# Patient Record
Sex: Female | Born: 1950 | Race: White | Hispanic: No | Smoking: Current every day smoker
Health system: Southern US, Community
[De-identification: ages and names within clinical notes are randomized; demographics above are authoritative.]

## PROBLEM LIST (undated history)

## (undated) DIAGNOSIS — E785 Hyperlipidemia, unspecified: Secondary | ICD-10-CM

## (undated) DIAGNOSIS — Z1239 Encounter for other screening for malignant neoplasm of breast: Secondary | ICD-10-CM

## (undated) DIAGNOSIS — M545 Low back pain, unspecified: Secondary | ICD-10-CM

## (undated) DIAGNOSIS — N3090 Cystitis, unspecified without hematuria: Secondary | ICD-10-CM

## (undated) DIAGNOSIS — K5792 Diverticulitis of intestine, part unspecified, without perforation or abscess without bleeding: Secondary | ICD-10-CM

## (undated) DIAGNOSIS — N059 Unspecified nephritic syndrome with unspecified morphologic changes: Secondary | ICD-10-CM

## (undated) DIAGNOSIS — Z1211 Encounter for screening for malignant neoplasm of colon: Secondary | ICD-10-CM

## (undated) DIAGNOSIS — Z87891 Personal history of nicotine dependence: Secondary | ICD-10-CM

## (undated) DIAGNOSIS — C182 Malignant neoplasm of ascending colon: Secondary | ICD-10-CM

## (undated) HISTORY — DX: Malignant neoplasm of ascending colon: C18.2

## (undated) HISTORY — PX: COLONOSCOPY: SHX174

## (undated) HISTORY — PX: BACK SURGERY: SHX140

## (undated) HISTORY — PX: ANAL FISTULECTOMY: SHX1139

## (undated) HISTORY — DX: Personal history of nicotine dependence: Z87.891

## (undated) HISTORY — DX: Cystitis, unspecified without hematuria: N30.90

## (undated) HISTORY — DX: Encounter for screening for malignant neoplasm of colon: Z12.11

## (undated) HISTORY — PX: LAPAROSCOPIC PARTIAL COLECTOMY: SHX5907

## (undated) HISTORY — DX: Encounter for other screening for malignant neoplasm of breast: Z12.39

---
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
## 1979-09-14 HISTORY — PX: ABDOMINAL HYSTERECTOMY: SHX81

## 1997-09-13 HISTORY — PX: NECK SURGERY: SHX720

## 1998-04-25 ENCOUNTER — Ambulatory Visit (HOSPITAL_COMMUNITY): Admission: RE | Admit: 1998-04-25 | Discharge: 1998-04-26 | Payer: Self-pay | Admitting: Neurosurgery

## 1998-04-29 ENCOUNTER — Inpatient Hospital Stay (HOSPITAL_COMMUNITY): Admission: RE | Admit: 1998-04-29 | Discharge: 1998-05-01 | Payer: Self-pay | Admitting: Neurosurgery

## 2001-09-13 DIAGNOSIS — C182 Malignant neoplasm of ascending colon: Secondary | ICD-10-CM

## 2001-09-13 HISTORY — PX: COLON SURGERY: SHX602

## 2001-09-13 HISTORY — DX: Malignant neoplasm of ascending colon: C18.2

## 2006-06-03 ENCOUNTER — Ambulatory Visit: Payer: Self-pay | Admitting: General Surgery

## 2007-01-28 ENCOUNTER — Emergency Department: Payer: Self-pay | Admitting: Emergency Medicine

## 2007-01-30 ENCOUNTER — Inpatient Hospital Stay: Payer: Self-pay | Admitting: General Surgery

## 2007-04-18 ENCOUNTER — Ambulatory Visit (HOSPITAL_BASED_OUTPATIENT_CLINIC_OR_DEPARTMENT_OTHER): Admission: RE | Admit: 2007-04-18 | Discharge: 2007-04-18 | Payer: Self-pay | Admitting: Orthopaedic Surgery

## 2008-07-12 IMAGING — CT CT ABD-PELV W/ CM
1 of 2 series · 15 of 32 positions shown, 19 images · non-contrast
Comparison: none

REASON FOR EXAM: (1) rlq pain, rm 15; (2) rlq pain, rm 15
COMMENTS:

PROCEDURE:     CT  - CT ABDOMEN / PELVIS  W  - January 28, 2007  [DATE]
RESULT:     Compared to report from CT examination of 04/13/2002.

[Series 2: appendicitis · axial · 0.70mm/px · z∈[-474,-69]mm · 15 of 149 slices shown, 19 images]
[im 7/149  soft-tissue]
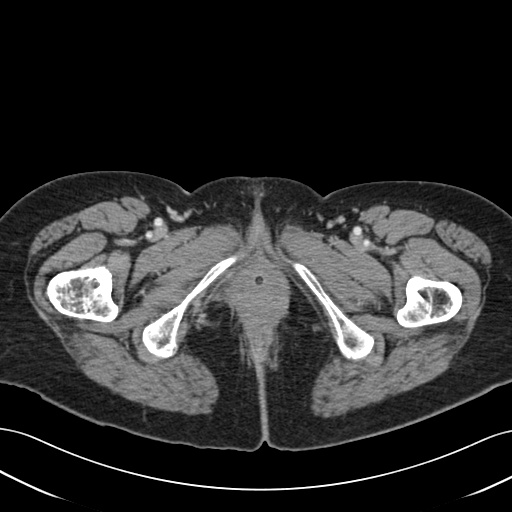
[im 7/149  bone]
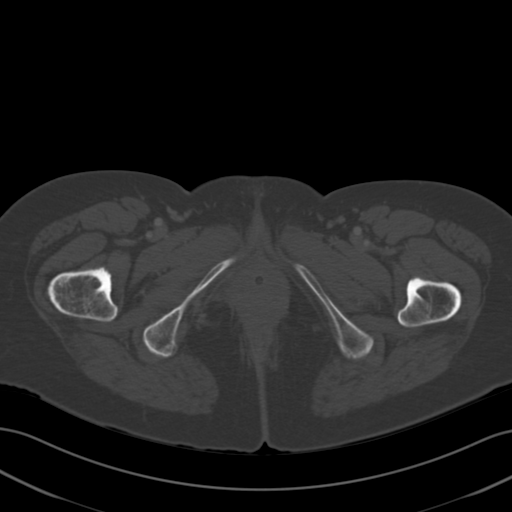
[im 19/149  soft-tissue]
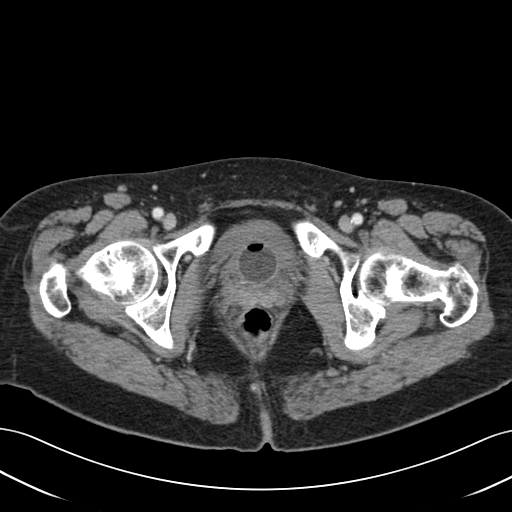
[im 31/149  soft-tissue]
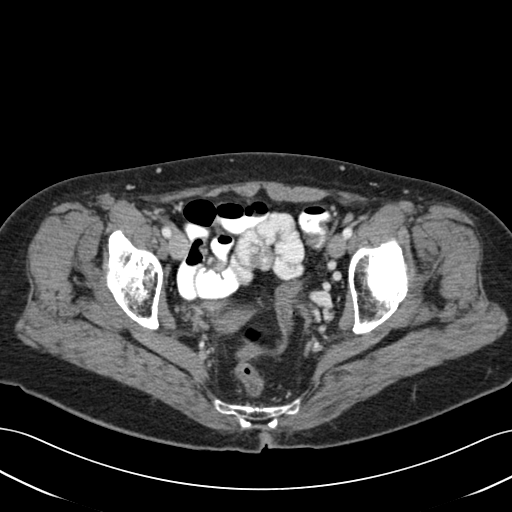
[im 44/149  soft-tissue]
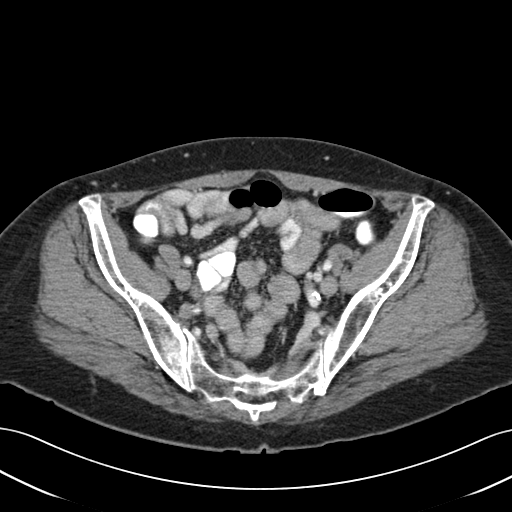
[im 50/149  soft-tissue]
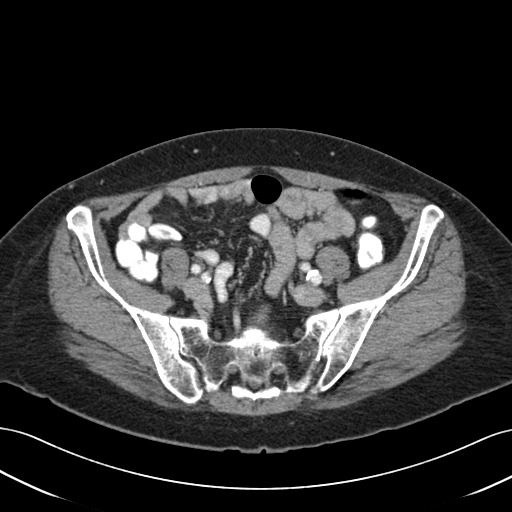
[im 62/149  soft-tissue]
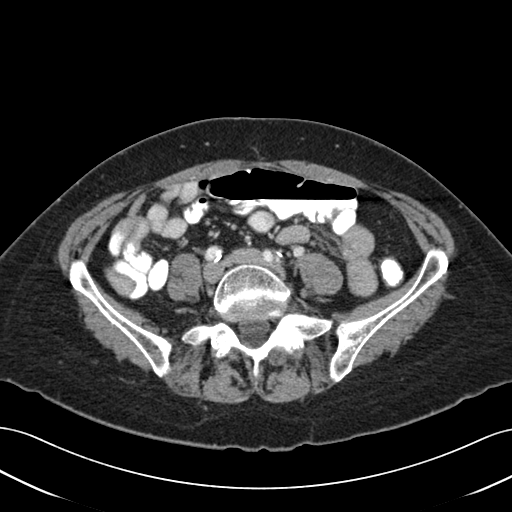
[im 75/149  soft-tissue]
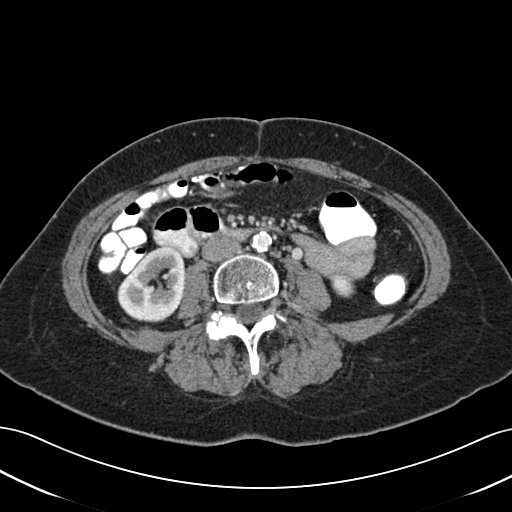
[im 87/149  soft-tissue]
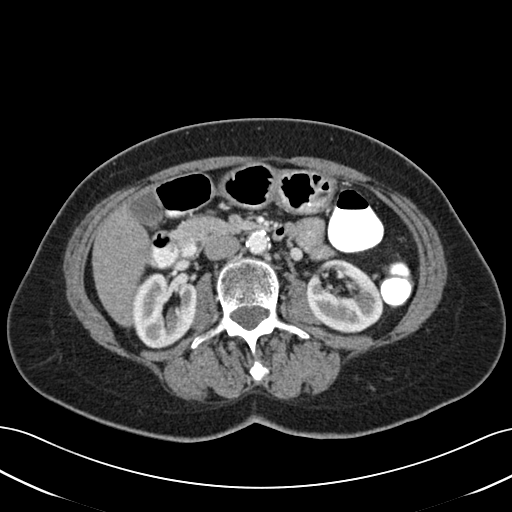
[im 99/149  soft-tissue]
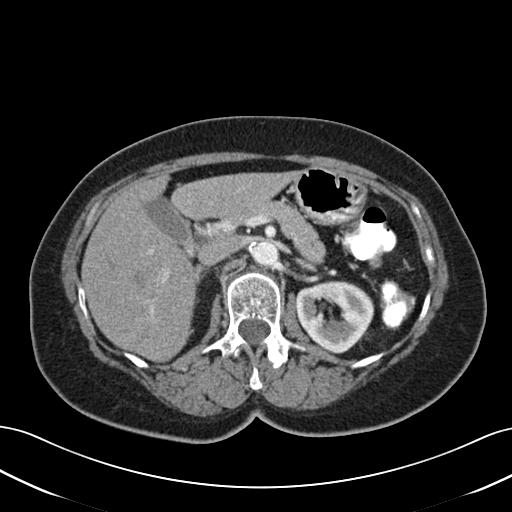
[im 99/149  bone]
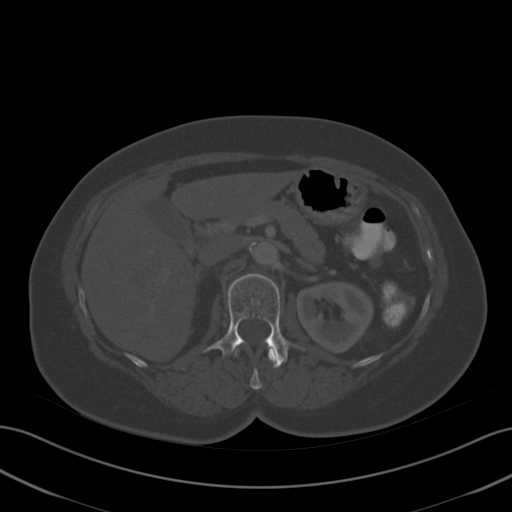
[im 105/149  soft-tissue]
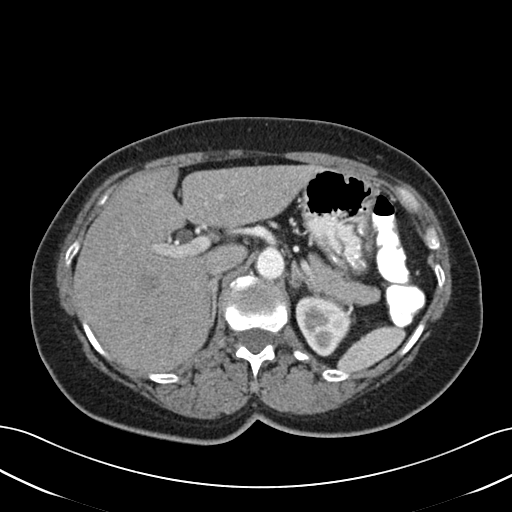
[im 118/149  soft-tissue]
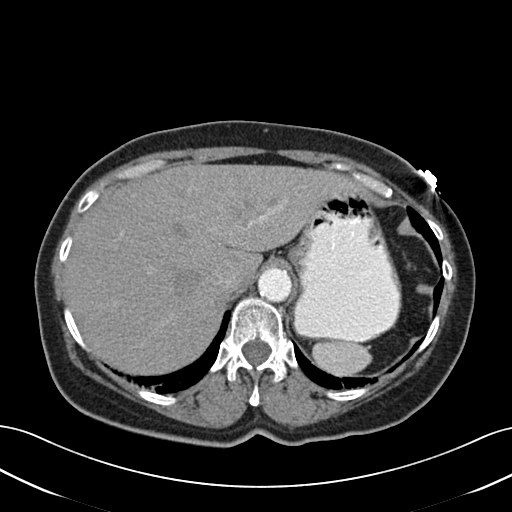
[im 124/149  lung]
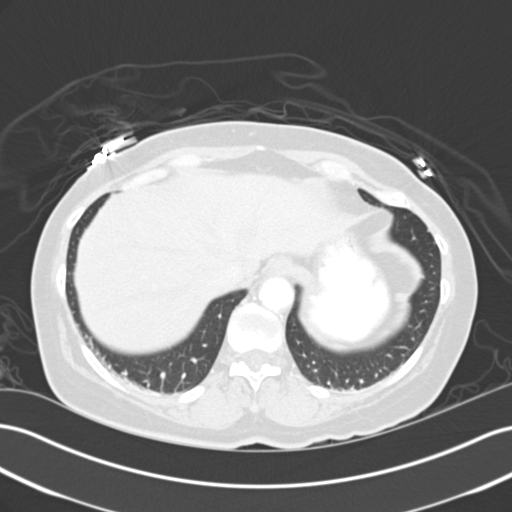
[im 130/149  soft-tissue]
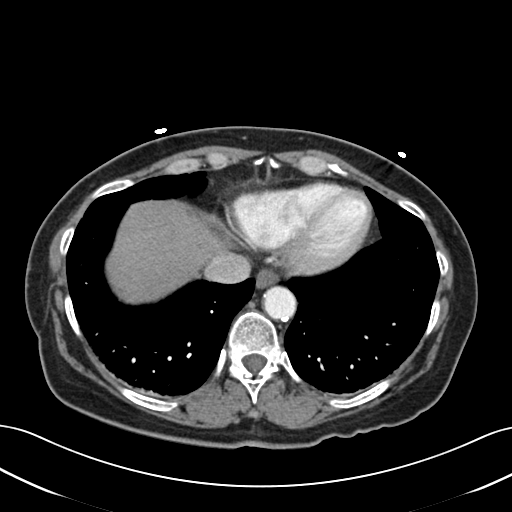
[im 130/149  lung]
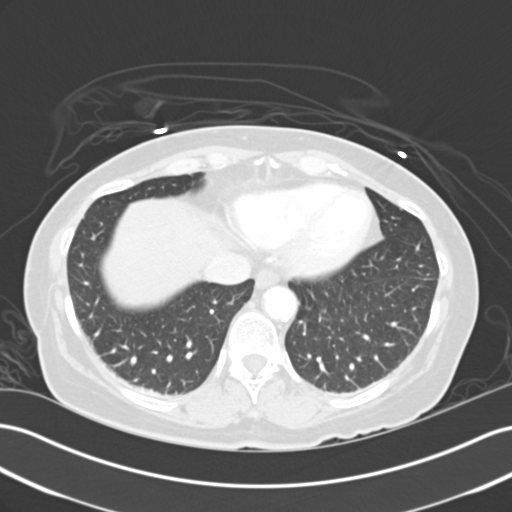
[im 136/149  lung]
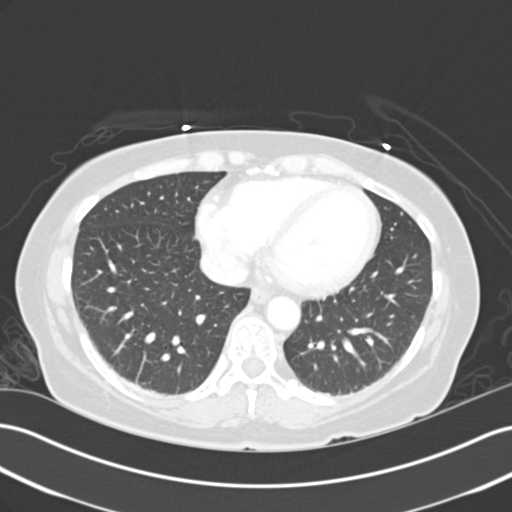
[im 142/149  soft-tissue]
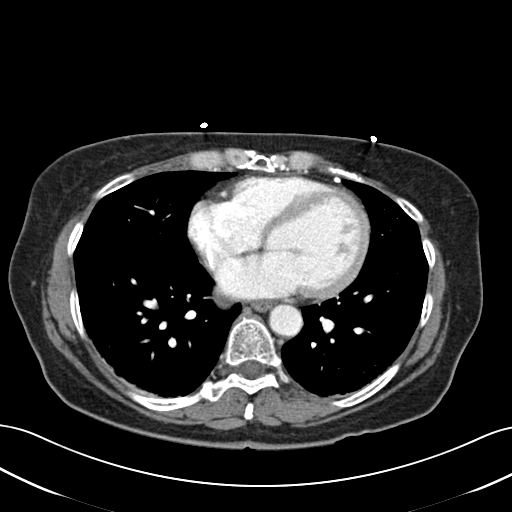
[im 142/149  lung]
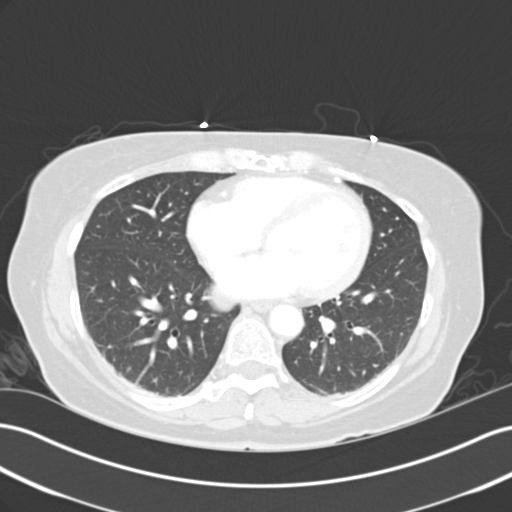

[15 of 32 positions shown; findings below may reference images not displayed]

FINDINGS: The visualized lung bases demonstrate minimal dependent subsegmental
atelectasis.

 The liver, gallbladder, spleen, pancreas, and adrenal glands appear
unremarkable.

Both kidneys appear unremarkable without evidence of stones, masses, or
hydronephrosis. There is a Foley catheter in place in the bladder is
collapsed.

There is diffuse atherosclerotic disease with patent mesenteric and iliac
vessels. There is no significant retroperitoneal lymphadenopathy.

The patient is status post right hemicolectomy. The bowel appears
unremarkable without evidence of bowel wall thickening, masses, or
destruction. There is minimal fat stranding associated with the right lower
quadrant mesentery of unclear significance.

There diffuse degenerative changes with no suspicious lytic or blastic bone
lesions.
IMPRESSION: Minimal fat stranding associated with the mesentery of the right lower
quadrant of unclear significance.

No bowel or other solid organ abnormality is identified.

## 2008-09-13 DIAGNOSIS — N3090 Cystitis, unspecified without hematuria: Secondary | ICD-10-CM

## 2008-09-13 HISTORY — DX: Cystitis, unspecified without hematuria: N30.90

## 2011-01-26 NOTE — Op Note (Signed)
Shelby Stone, Shelby Stone                 ACCOUNT NO.:  [id number]   MEDICAL RECORD NO.:  [id number]          PATIENT TYPE:  AMB   LOCATION:  DSC                          FACILITY:  [facility]   PHYSICIAN:  Sharolyn Douglas, M.D.        DATE OF BIRTH:  1951-08-26   DATE OF PROCEDURE:  DATE OF DISCHARGE:                               OPERATIVE REPORT   DIAGNOSIS:  Degenerative disc disease and right thigh pain.   PROCEDURE:  1. Right L2-3 transforaminal epidural steroid injection.  2. Fluoroscopic imaging used for needle placement of the above      injection.   SURGEON:  Sharolyn Douglas, M.D.   ASSISTANT:  None.   ANESTHESIA:  MAC plus local.   COMPLICATIONS:  None.   INDICATIONS:  The patient is a pleasant 60 year old female with  persistent back and right leg pain, thought to be secondary to disk  disease at this level and possible L2 nerve root involvement.  She has  failed other treatment options, now presents for diagnostic therapeutic  L2 nerve root block.  Risks, benefits, alternatives were reviewed.   PROCEDURE:  After informed consent, she was taken to the operating room.  She underwent sedation by Anesthesia.  She was turned prone.  The back  was prepped, draped, usual sterile fashion.  Oblique imaging was  obtained of the L2 segment.  A 22-gauge Quincke spinal needle was  advanced in a posterior oblique direction underneath the right L2  pedicle.  Aspiration showed no blood or CSF.  One mL of Omnipaque  injected which showed good epidural spread as well as along the nerve  root.  There was no intravascular uptake.  We then injected a solution  of 120 mg Depo-Medrol and 3 mL 1% preservative-free lidocaine.  The  patient tolerated the procedure well.  No apparent complications.  Transferred to recovery in stable condition.  I reviewed post-injection  instructions with her.  Follow up in 2-3 weeks.      Sharolyn Douglas, M.D.  Electronically Signed     MC/MEDQ  D:  04/18/2007  T:   04/18/2007  Job:  [id number]

## 2011-06-28 LAB — POCT HEMOGLOBIN-HEMACUE
Hemoglobin: 15.4 — ABNORMAL HIGH
Operator id: 112821

## 2011-07-30 ENCOUNTER — Ambulatory Visit: Payer: Self-pay | Admitting: General Surgery

## 2011-08-02 LAB — PATHOLOGY REPORT

## 2013-03-29 ENCOUNTER — Encounter: Payer: Self-pay | Admitting: *Deleted

## 2013-03-29 DIAGNOSIS — C182 Malignant neoplasm of ascending colon: Secondary | ICD-10-CM | POA: Insufficient documentation

## 2013-03-29 DIAGNOSIS — Z1211 Encounter for screening for malignant neoplasm of colon: Secondary | ICD-10-CM | POA: Insufficient documentation

## 2016-06-09 ENCOUNTER — Encounter: Payer: Self-pay | Admitting: *Deleted

## 2016-08-03 ENCOUNTER — Encounter: Payer: Self-pay | Admitting: *Deleted

## 2016-08-09 ENCOUNTER — Encounter: Payer: Self-pay | Admitting: General Surgery

## 2016-08-09 ENCOUNTER — Ambulatory Visit (INDEPENDENT_AMBULATORY_CARE_PROVIDER_SITE_OTHER): Payer: BLUE CROSS/BLUE SHIELD | Admitting: General Surgery

## 2016-08-09 VITALS — BP 132/76 | HR 86 | Resp 14 | Ht 63.0 in | Wt 148.0 lb

## 2016-08-09 DIAGNOSIS — Z86010 Personal history of colonic polyps: Secondary | ICD-10-CM

## 2016-08-09 MED ORDER — METOCLOPRAMIDE HCL 10 MG PO TABS: ORAL_TABLET | ORAL | 0 refills | Status: DC

## 2016-08-09 MED ORDER — POLYETHYLENE GLYCOL 3350 17 GM/SCOOP PO POWD: ORAL | 0 refills | Status: DC

## 2016-08-09 NOTE — Patient Instructions (Signed)
Colonoscopy, Adult A colonoscopy is an exam to look at the entire large intestine. During the exam, a lubricated, bendable tube is inserted into the anus and then passed into the rectum, colon, and other parts of the large intestine. A colonoscopy is often done as a part of normal colorectal screening or in response to certain symptoms, such as anemia, persistent diarrhea, abdominal pain, and blood in the stool. The exam can help screen for and diagnose medical problems, including:  Tumors.  Polyps.  Inflammation.  Areas of bleeding. Tell a health care provider about:  Any allergies you have.  All medicines you are taking, including vitamins, herbs, eye drops, creams, and over-the-counter medicines.  Any problems you or family members have had with anesthetic medicines.  Any blood disorders you have.  Any surgeries you have had.  Any medical conditions you have.  Any problems you have had passing stool. What are the risks? Generally, this is a safe procedure. However, problems may occur, including:  Bleeding.  A tear in the intestine.  A reaction to medicines given during the exam.  Infection (rare). What happens before the procedure? Eating and drinking restrictions  Follow instructions from your health care provider about eating and drinking, which may include:  A few days before the procedure - follow a low-fiber diet. Avoid nuts, seeds, dried fruit, raw fruits, and vegetables.  1-3 days before the procedure - follow a clear liquid diet. Drink only clear liquids, such as clear broth or bouillon, black coffee or tea, clear juice, clear soft drinks or sports drinks, gelatin desert, and popsicles. Avoid any liquids that contain red or purple dye.  On the day of the procedure - do not eat or drink anything during the 2 hours before the procedure, or within the time period that your health care provider recommends. Bowel prep  If you were prescribed an oral bowel prep to  clean out your colon:  Take it as told by your health care provider. Starting the day before your procedure, you will need to drink a large amount of medicated liquid. The liquid will cause you to have multiple loose stools until your stool is almost clear or light green.  If your skin or anus gets irritated from diarrhea, you may use these to relieve the irritation:  Medicated wipes, such as adult wet wipes with aloe and vitamin E.  A skin soothing-product like petroleum jelly.  If you vomit while drinking the bowel prep, take a break for up to 60 minutes and then begin the bowel prep again. If vomiting continues and you cannot take the bowel prep without vomiting, call your health care provider. General instructions  Ask your health care provider about changing or stopping your regular medicines. This is especially important if you are taking diabetes medicines or blood thinners.  Plan to have someone take you home from the hospital or clinic. What happens during the procedure?  An IV tube may be inserted into one of your veins.  You will be given medicine to help you relax (sedative).  To reduce your risk of infection:  Your health care team will wash or sanitize their hands.  Your anal area will be washed with soap.  You will be asked to lie on your side with your knees bent.  Your health care provider will lubricate a long, thin, flexible tube. The tube will have a camera and a light on the end.  The tube will be inserted into your anus.  The tube will be gently eased through your rectum and colon.  Air will be delivered into your colon to keep it open. You may feel some pressure or cramping.  The camera will be used to take images during the procedure.  A small tissue sample may be removed from your body to be examined under a microscope (biopsy). If any potential problems are found, the tissue will be sent to a lab for testing.  If small polyps are found, your health  care provider may remove them and have them checked for cancer cells.  The tube that was inserted into your anus will be slowly removed. The procedure may vary among health care providers and hospitals. What happens after the procedure?  Your blood pressure, heart rate, breathing rate, and blood oxygen level will be monitored until the medicines you were given have worn off.  Do not drive for 24 hours after the exam.  You may have a small amount of blood in your stool.  You may pass gas and have mild abdominal cramping or bloating due to the air that was used to inflate your colon during the exam.  It is up to you to get the results of your procedure. Ask your health care provider, or the department performing the procedure, when your results will be ready. This information is not intended to replace advice given to you by your health care provider. Make sure you discuss any questions you have with your health care provider. Document Released: 08/27/2000 Document Revised: 03/19/2016 Document Reviewed: 11/11/2015 Elsevier Interactive Patient Education  2017 [employer]vier Inc[employer]  

## 2016-08-09 NOTE — Progress Notes (Signed)
Patient ID: Shelby Stone, female   DOB: [birth year], 65 y.o.   MRN: [id number]  Chief Complaint  Patient presents with  . Colonoscopy    HPI Shelby Stone is a 65 y.o. female here today for an evaluation for a colonoscopy. Last one was done in 2012. Patient states no GI issues. Moves her bowels daily. HPI  Past Medical History:  Diagnosis Date  . Breast screening, unspecified   . Cystitis 2010  . 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  . Personal history of tobacco use, presenting hazards to health   . Special screening for malignant neoplasms, colon     Past Surgical History:  Procedure Laterality Date  . ABDOMINAL HYSTERECTOMY  1981  . ANAL FISTULECTOMY  [id number]  . COLON SURGERY  2003   resection  . COLONOSCOPY  2007, 2012   Hyperplastic polyps noted in 2012  . NECK SURGERY  1999    No family history on file.  Social History Social History  Substance Use Topics  . Smoking status: Current Every Day Smoker    Packs/day: 0.50    Years: 40.00  . Smokeless tobacco: Never Used  . Alcohol use Yes    Allergies  Allergen Reactions  . Penicillins Rash  . Sulfa Antibiotics Rash    Current Outpatient Prescriptions  Medication Sig Dispense Refill  . aspirin 81 MG chewable tablet Chew by mouth daily.    . cetirizine (ZYRTEC) 10 MG tablet Take 10 mg by mouth daily.    Marland Kitchen co-enzyme Q-10 30 MG capsule Take 30 mg by mouth 3 (three) times daily.    . Multiple Vitamins-Minerals (CENTRUM SILVER) CHEW Chew by mouth.    . simvastatin (ZOCOR) 40 MG tablet Take 40 mg by mouth daily.    . metoCLOPramide (REGLAN) 10 MG tablet Take one pill half hour before drinking Miralax prep 2 tablet 0  . polyethylene glycol powder (GLYCOLAX/MIRALAX) powder 255 grams one bottle for colonoscopy prep 255 g 0   No current facility-administered medications for this visit.     Review of Systems Review of Systems  Constitutional: Negative.    Respiratory: Negative.   Cardiovascular: Negative.   Gastrointestinal: Negative.     Blood pressure 132/76, pulse 86, resp. rate 14, height 5\' 3"  (1.6 m), weight 148 lb (67.1 kg).  Physical Exam Physical Exam  Constitutional: She is oriented to person, place, and time. She appears well-developed and well-nourished.  Eyes: Conjunctivae are normal. No scleral icterus.  Cardiovascular: Normal rate, regular rhythm and normal heart sounds.   Pulmonary/Chest: Effort normal and breath sounds normal.  Abdominal: Soft. Bowel sounds are normal. There is no tenderness.  Neurological: She is alert and oriented to person, place, and time.  Skin: Skin is warm and dry.    Data Reviewed 07/30/2011 colonoscopy showed 25 Shelby Stone polyps in the rectum (hyperplastic) sees. A few [place] diverticuli were noted in the sigmoid colon. The anastomosis was normal.  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    Plan     Colonoscopy with possible biopsy/polypectomy prn: Information regarding the procedure, including its potential risks and complications (including but not limited to perforation of the bowel, which may require emergency surgery to repair, and bleeding) was verbally given to the patient. Educational information regarding lower intestinal endoscopy was given to the patient. Written instructions for how to complete the  bowel prep using Miralax were provided. The importance of drinking ample fluids to avoid dehydration as a result of the prep emphasized.   She will take one Reglan pill 30 mins prior to Miralax prep.     Patient has been scheduled for a colonoscopy on 10-13-16 at [facility]. It is okay for patient to continue 81 mg aspirin once daily.    Robert Bellow 08/09/2016, 9:06 PM

## 2016-10-07 ENCOUNTER — Telehealth: Payer: Self-pay | Admitting: *Deleted

## 2016-10-07 NOTE — Telephone Encounter (Signed)
Message left on home number for patient to call the office.   Patient is scheduled for a colonoscopy on 10-13-16 at [facility].   We need to confirm patient medications and make sure she has picked up Miralax prescription.

## 2016-10-08 NOTE — Telephone Encounter (Signed)
Patient confirms no medication changes and has her Miralax already. No questions about prep.

## 2016-10-12 ENCOUNTER — Encounter: Payer: Self-pay | Admitting: *Deleted

## 2016-10-13 ENCOUNTER — Encounter: Payer: Self-pay | Admitting: *Deleted

## 2016-10-13 ENCOUNTER — Ambulatory Visit
Admission: RE | Admit: 2016-10-13 | Discharge: 2016-10-13 | Disposition: A | Discharge status: Home / Self Care | Payer: BLUE CROSS/BLUE SHIELD | Source: Ambulatory Visit | Attending: General Surgery | Admitting: General Surgery

## 2016-10-13 ENCOUNTER — Ambulatory Visit: Payer: BLUE CROSS/BLUE SHIELD | Admitting: Anesthesiology

## 2016-10-13 ENCOUNTER — Encounter
Admission: RE | Disposition: A | Discharge status: Home / Self Care | Payer: Self-pay | Source: Ambulatory Visit | Attending: General Surgery

## 2016-10-13 DIAGNOSIS — Z86010 Personal history of colonic polyps: Secondary | ICD-10-CM | POA: Diagnosis not present

## 2016-10-13 DIAGNOSIS — F17200 Nicotine dependence, unspecified, uncomplicated: Secondary | ICD-10-CM | POA: Insufficient documentation

## 2016-10-13 DIAGNOSIS — D127 Benign neoplasm of rectosigmoid junction: Secondary | ICD-10-CM | POA: Insufficient documentation

## 2016-10-13 DIAGNOSIS — Z1211 Encounter for screening for malignant neoplasm of colon: Secondary | ICD-10-CM | POA: Insufficient documentation

## 2016-10-13 DIAGNOSIS — Z7982 Long term (current) use of aspirin: Secondary | ICD-10-CM | POA: Diagnosis not present

## 2016-10-13 DIAGNOSIS — Z880 Allergy status to penicillin: Secondary | ICD-10-CM | POA: Insufficient documentation

## 2016-10-13 DIAGNOSIS — D125 Benign neoplasm of sigmoid colon: Secondary | ICD-10-CM | POA: Diagnosis not present

## 2016-10-13 DIAGNOSIS — J449 Chronic obstructive pulmonary disease, unspecified: Secondary | ICD-10-CM | POA: Diagnosis not present

## 2016-10-13 DIAGNOSIS — Z79899 Other long term (current) drug therapy: Secondary | ICD-10-CM | POA: Insufficient documentation

## 2016-10-13 HISTORY — PX: COLONOSCOPY WITH PROPOFOL: SHX5780

## 2016-10-13 SURGERY — COLONOSCOPY WITH PROPOFOL
Anesthesia: General

## 2016-10-13 MED ORDER — FENTANYL CITRATE (PF) 100 MCG/2ML IJ SOLN
INTRAMUSCULAR | Status: DC | PRN
  Administered 2016-10-13: 100 ug via INTRAVENOUS

## 2016-10-13 MED ORDER — LIDOCAINE HCL (CARDIAC) 20 MG/ML IV SOLN
INTRAVENOUS | Status: DC | PRN
  Administered 2016-10-13: 2 mL via INTRAVENOUS

## 2016-10-13 MED ORDER — LIDOCAINE HCL (PF) 2 % IJ SOLN
INTRAMUSCULAR | Status: AC
  Filled 2016-10-13: qty 2

## 2016-10-13 MED ORDER — PROPOFOL 500 MG/50ML IV EMUL
INTRAVENOUS | Status: AC
  Filled 2016-10-13: qty 50

## 2016-10-13 MED ORDER — MIDAZOLAM HCL 2 MG/2ML IJ SOLN
INTRAMUSCULAR | Status: DC | PRN
  Administered 2016-10-13: 2 mg via INTRAVENOUS

## 2016-10-13 MED ORDER — ONDANSETRON HCL 4 MG/2ML IJ SOLN
INTRAMUSCULAR | Status: AC
  Filled 2016-10-13: qty 2

## 2016-10-13 MED ORDER — FENTANYL CITRATE (PF) 100 MCG/2ML IJ SOLN
INTRAMUSCULAR | Status: AC
  Filled 2016-10-13: qty 2

## 2016-10-13 MED ORDER — PROPOFOL 500 MG/50ML IV EMUL
INTRAVENOUS | Status: DC | PRN
  Administered 2016-10-13: 120 ug/kg/min via INTRAVENOUS

## 2016-10-13 MED ORDER — SODIUM CHLORIDE 0.9 % IV SOLN
INTRAVENOUS | Status: DC
  Administered 2016-10-13: 07:00:00 via INTRAVENOUS

## 2016-10-13 MED ORDER — MIDAZOLAM HCL 2 MG/2ML IJ SOLN
INTRAMUSCULAR | Status: AC
  Filled 2016-10-13: qty 2

## 2016-10-13 MED ORDER — PROPOFOL 10 MG/ML IV BOLUS
INTRAVENOUS | Status: DC | PRN
  Administered 2016-10-13: 30 mg via INTRAVENOUS

## 2016-10-13 NOTE — Anesthesia Post-op Follow-up Note (Signed)
Anesthesia QCDR form completed.        

## 2016-10-13 NOTE — Anesthesia Postprocedure Evaluation (Signed)
Anesthesia Post Note  Patient: Shelby Stone  Procedure(s) Performed: Procedure(s) (LRB): COLONOSCOPY WITH PROPOFOL (N/A)  Patient location during evaluation: PACU Anesthesia Type: General Level of consciousness: awake Pain management: pain level controlled Vital Signs Assessment: post-procedure vital signs reviewed and stable Respiratory status: spontaneous breathing Cardiovascular status: stable Anesthetic complications: no     Last Vitals:  Vitals:   10/13/16 0822 10/13/16 0832  BP: (!) 141/82 132/73  Pulse: 78 73  Resp: 16 16  Temp:      Last Pain:  Vitals:   10/13/16 0802  TempSrc: Tympanic                 VAN STAVEREN,Madalynne Gutmann

## 2016-10-13 NOTE — Anesthesia Preprocedure Evaluation (Signed)
Anesthesia Evaluation  Patient identified by MRN, date of birth, ID band Patient awake    Reviewed: Allergy & Precautions, NPO status , Patient's Chart, lab work & pertinent test results  Airway Mallampati: II       Dental  (+) Teeth Intact   Pulmonary COPD, Current Smoker,     + decreased breath sounds      Cardiovascular Exercise Tolerance: Good  Rhythm:Regular     Neuro/Psych    GI/Hepatic negative GI ROS, Neg liver ROS,   Endo/Other  negative endocrine ROS  Renal/GU negative Renal ROS     Musculoskeletal negative musculoskeletal ROS (+)   Abdominal Normal abdominal exam  (+)   Peds  Hematology negative hematology ROS (+)   Anesthesia Other Findings   Reproductive/Obstetrics                             Anesthesia Physical Anesthesia Plan  ASA: II  Anesthesia Plan: General   Post-op Pain Management:    Induction: Intravenous  Airway Management Planned: Natural Airway and Nasal Cannula  Additional Equipment:   Intra-op Plan:   Post-operative Plan:   Informed Consent: I have reviewed the patients History and Physical, chart, labs and discussed the procedure including the risks, benefits and alternatives for the proposed anesthesia with the patient or authorized representative who has indicated his/her understanding and acceptance.     Plan Discussed with: Surgeon  Anesthesia Plan Comments:         Anesthesia Quick Evaluation

## 2016-10-13 NOTE — H&P (Signed)
Shelby Stone [id number] 05-28-1951     HPI:  Past history tubulovillous polyp requiring right colectomy.  For follow up exam. N/V w/ prep in spite of reglan.   Prescriptions Prior to Admission  Medication Sig Dispense Refill Last Dose  . aspirin 81 MG chewable tablet Chew by mouth daily.   10/12/2016 at Unknown time  . cetirizine (ZYRTEC) 10 MG tablet Take 10 mg by mouth daily.   10/12/2016 at Unknown time  . co-enzyme Q-10 30 MG capsule Take 30 mg by mouth 3 (three) times daily.   10/12/2016 at Unknown time  . metoCLOPramide (REGLAN) 10 MG tablet Take one pill half hour before drinking Miralax prep 2 tablet 0 10/12/2016 at Unknown time  . Multiple Vitamins-Minerals (CENTRUM SILVER) CHEW Chew by mouth.   10/12/2016 at Unknown time  . simvastatin (ZOCOR) 40 MG tablet Take 40 mg by mouth daily.   10/12/2016 at Unknown time  . polyethylene glycol powder (GLYCOLAX/MIRALAX) powder 255 grams one bottle for colonoscopy prep 255 g 0    Allergies  Allergen Reactions  . Penicillins Rash  . Sulfa Antibiotics Rash   Past Medical History:  Diagnosis Date  . Breast screening, unspecified   . Cystitis 2010  . 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  . Personal history of tobacco use, presenting hazards to health   . Special screening for malignant neoplasms, colon    Past Surgical History:  Procedure Laterality Date  . ABDOMINAL HYSTERECTOMY  1981  . ANAL FISTULECTOMY  [id number]  . COLON SURGERY  2003   resection  . COLONOSCOPY  2007, 2012   Hyperplastic polyps noted in 2012  . NECK SURGERY  1999   Social History   Social History  . Marital status: Divorced    Spouse name: N/A  . Number of children: N/A  . Years of education: N/A   Occupational History  . Not on file.   Social History Main Topics  . Smoking status: Current Every Day Smoker    Packs/day: 0.50    Years: 40.00  . Smokeless tobacco: Never Used  . Alcohol use  Yes  . Drug use: No  . Sexual activity: Not on file   Other Topics Concern  . Not on file   Social History Narrative  . No narrative on file   Social History   Social History Narrative  . No narrative on file     ROS: Negative.     PE: HEENT: Negative. Lungs: Clear. Cardio: RR. Assessment/Plan:  Proceed with planned endoscopy.  Robert Bellow 10/13/2016   Assessment/Plan:  Proceed with planned endoscopy.

## 2016-10-13 NOTE — Op Note (Signed)
[facility] Patient Name: Shelby Stone Procedure Date: 10/13/2016 7:30 AM MRN: [id number] Account #: [id number] Date of Birth: [birth year] Admit Type: Outpatient Age: 66 Room: [facility] ENDO ROOM 1 Gender: Female Note Status: Finalized Procedure:            Colonoscopy Indications:          High risk colon cancer surveillance: Personal history                        of colonic polyps Providers:            Robert Bellow, MD Referring MD:         Leona Carry. Hall Busing, MD (Referring MD) Medicines:            Monitored Anesthesia Care Procedure:            Pre-Anesthesia Assessment:                       - Prior to the procedure, a History and Physical was                        performed, and patient medications, allergies and                        sensitivities were reviewed. The patient's tolerance of                        previous anesthesia was reviewed.                       - The risks and benefits of the procedure and the                        sedation options and risks were discussed with the                        patient. All questions were answered and informed                        consent was obtained.                       After obtaining informed consent, the colonoscope was                        passed under direct vision. Throughout the procedure,                        the patient's blood pressure, pulse, and oxygen                        saturations were monitored continuously. The                        Colonoscope was introduced through the anus and                        advanced to the the ileocolonic anastomosis. The                        colonoscopy was somewhat difficult due to totuosity in  the recto-sigmoid. The patient tolerated the procedure                        well. The quality of the bowel preparation was                        excellent. Findings:      A 9 mm polyp was found in the  recto-sigmoid colon. The polyp was       sessile. The polyp was removed with a hot snare. Resection and retrieval       were complete.      The retroflexed view of the distal rectum and anal verge was normal and       showed no anal or rectal abnormalities. Impression:           - One 9 mm polyp at the recto-sigmoid colon, removed                        with a hot snare. Resected and retrieved.                       - The distal rectum and anal verge are normal on                        retroflexion view. Recommendation:       - Telephone endoscopist for pathology results in 1 week.                       - Repeat colonoscopy in 5 years for surveillance. Robert Bellow, MD 10/13/2016 7:58:21 AM This report has been signed electronically. Number of Addenda: 0 Note Initiated On: 10/13/2016 7:30 AM Scope Withdrawal Time: 0 hours 10 minutes 4 seconds  Total Procedure Duration: 0 hours 16 minutes 7 seconds       [facility]

## 2016-10-13 NOTE — Transfer of Care (Signed)
Immediate Anesthesia Transfer of Care Note  Patient: Shelby Stone  Procedure(s) Performed: Procedure(s): COLONOSCOPY WITH PROPOFOL (N/A)  Patient Location: PACU  Anesthesia Type:General  Level of Consciousness: awake and alert   Airway & Oxygen Therapy: Patient Spontanous Breathing and Patient connected to nasal cannula oxygen  Post-op Assessment: Report given to RN and Post -op Vital signs reviewed and stable  Post vital signs: Reviewed  Last Vitals:  Vitals:   10/13/16 0710  BP: (!) 172/91  Pulse: 74  Resp: 18  Temp: 37 C    Last Pain:  Vitals:   10/13/16 0710  TempSrc: Tympanic         Complications: No apparent anesthesia complications

## 2016-10-14 ENCOUNTER — Encounter: Payer: Self-pay | Admitting: General Surgery

## 2016-10-15 LAB — SURGICAL PATHOLOGY

## 2016-10-18 ENCOUNTER — Telehealth: Payer: Self-pay

## 2016-10-18 NOTE — Telephone Encounter (Signed)
Notified patient as instructed, patient pleased. Discussed follow-up appointments, patient agrees. Patient placed in recalls.   

## 2016-10-18 NOTE — Telephone Encounter (Signed)
-----   Message from Robert Bellow, MD sent at 10/18/2016  9:20 AM EST -----  Please notify the patient pathology was fine. Follow up exam in five years.  ----- Message ----- From: Interface, Lab In Three Zero One Sent: 10/15/2016   6:36 PM To: Robert Bellow, MD

## 2016-11-24 ENCOUNTER — Encounter: Payer: Self-pay | Admitting: General Surgery

## 2016-11-25 ENCOUNTER — Encounter: Payer: Self-pay | Admitting: General Surgery

## 2016-11-29 ENCOUNTER — Encounter: Payer: Self-pay | Admitting: General Surgery

## 2019-06-21 ENCOUNTER — Other Ambulatory Visit: Payer: Self-pay | Admitting: Unknown Physician Specialty

## 2019-06-21 ENCOUNTER — Other Ambulatory Visit (HOSPITAL_COMMUNITY): Payer: Self-pay | Admitting: Unknown Physician Specialty

## 2019-06-21 DIAGNOSIS — K118 Other diseases of salivary glands: Secondary | ICD-10-CM

## 2019-06-29 ENCOUNTER — Other Ambulatory Visit: Payer: Self-pay

## 2019-06-29 ENCOUNTER — Ambulatory Visit
Admission: RE | Admit: 2019-06-29 | Discharge: 2019-06-29 | Disposition: A | Discharge status: Home / Self Care | Payer: Medicare Other | Source: Ambulatory Visit | Attending: Unknown Physician Specialty | Admitting: Unknown Physician Specialty

## 2019-06-29 DIAGNOSIS — K118 Other diseases of salivary glands: Secondary | ICD-10-CM | POA: Diagnosis not present

## 2019-06-29 LAB — POCT I-STAT CREATININE: Creatinine, Ser: 0.6 mg/dL (ref 0.44–1.00)

## 2019-06-29 MED ORDER — IOHEXOL 300 MG/ML  SOLN
75.0000 mL | Freq: Once | INTRAMUSCULAR | Status: AC | PRN
  Administered 2019-06-29: 75 mL via INTRAVENOUS

## 2019-07-04 ENCOUNTER — Other Ambulatory Visit: Payer: Self-pay | Admitting: Unknown Physician Specialty

## 2019-07-04 DIAGNOSIS — K118 Other diseases of salivary glands: Secondary | ICD-10-CM

## 2019-07-11 NOTE — Progress Notes (Signed)
Spoke with patient today regarding her procedure on schedule for 07/13/2019, questions answered pre procedure, made aware to be here at 1000 on 07/13/2019, stated understanding. And NPO after mn prior to procedure.

## 2019-07-12 ENCOUNTER — Other Ambulatory Visit: Payer: Self-pay | Admitting: Radiology

## 2019-07-13 ENCOUNTER — Other Ambulatory Visit: Payer: Self-pay

## 2019-07-13 ENCOUNTER — Ambulatory Visit
Admission: RE | Admit: 2019-07-13 | Discharge: 2019-07-13 | Disposition: A | Discharge status: Home / Self Care | Payer: Medicare Other | Source: Ambulatory Visit | Attending: Unknown Physician Specialty | Admitting: Unknown Physician Specialty

## 2019-07-13 DIAGNOSIS — K118 Other diseases of salivary glands: Secondary | ICD-10-CM | POA: Insufficient documentation

## 2019-07-13 MED ORDER — FENTANYL CITRATE (PF) 100 MCG/2ML IJ SOLN
INTRAMUSCULAR | Status: AC | PRN
  Administered 2019-07-13 (×2): 25 ug via INTRAVENOUS
  Administered 2019-07-13: 50 ug via INTRAVENOUS

## 2019-07-13 MED ORDER — MIDAZOLAM HCL 2 MG/2ML IJ SOLN
INTRAMUSCULAR | Status: AC | PRN
  Administered 2019-07-13: 0.5 mg via INTRAVENOUS
  Administered 2019-07-13: 1 mg via INTRAVENOUS
  Administered 2019-07-13: 0.5 mg via INTRAVENOUS

## 2019-07-13 MED ORDER — HYDROCODONE-ACETAMINOPHEN 5-325 MG PO TABS: 1.0000 | ORAL_TABLET | ORAL | Status: DC | PRN

## 2019-07-13 MED ORDER — FENTANYL CITRATE (PF) 100 MCG/2ML IJ SOLN
INTRAMUSCULAR | Status: AC
  Filled 2019-07-13: qty 2

## 2019-07-13 MED ORDER — SODIUM CHLORIDE 0.9 % IV SOLN
INTRAVENOUS | Status: DC
  Administered 2019-07-13: 10:00:00 via INTRAVENOUS

## 2019-07-13 MED ORDER — MIDAZOLAM HCL 2 MG/2ML IJ SOLN
INTRAMUSCULAR | Status: AC
  Filled 2019-07-13: qty 2

## 2019-07-13 NOTE — Procedures (Signed)
  Procedure: [place] core biopsy LEFT parotid mass   [place] core biopsy RIGHT parotid mass with 29ml purulent/viscous aspirate EBL:   minimal Complications:  none immediate  See full dictation in [facility].  Dillard Cannon MD Main # [phone] Pager  [phone]

## 2019-07-16 LAB — SURGICAL PATHOLOGY

## 2019-08-02 ENCOUNTER — Other Ambulatory Visit: Admission: RE | Admit: 2019-08-02 | Payer: Medicare Other | Source: Ambulatory Visit

## 2019-08-02 ENCOUNTER — Other Ambulatory Visit: Payer: Medicare Other

## 2019-08-03 ENCOUNTER — Other Ambulatory Visit: Payer: Self-pay

## 2019-08-03 ENCOUNTER — Other Ambulatory Visit: Payer: Medicare Other

## 2019-08-03 ENCOUNTER — Encounter
Admission: RE | Admit: 2019-08-03 | Discharge: 2019-08-03 | Disposition: A | Discharge status: Home / Self Care | Payer: Medicare Other | Source: Ambulatory Visit | Attending: Unknown Physician Specialty | Admitting: Unknown Physician Specialty

## 2019-08-03 DIAGNOSIS — Z20828 Contact with and (suspected) exposure to other viral communicable diseases: Secondary | ICD-10-CM | POA: Insufficient documentation

## 2019-08-03 DIAGNOSIS — Z01818 Encounter for other preprocedural examination: Secondary | ICD-10-CM | POA: Diagnosis not present

## 2019-08-03 HISTORY — DX: Diverticulitis of intestine, part unspecified, without perforation or abscess without bleeding: K57.92

## 2019-08-03 HISTORY — DX: Unspecified nephritic syndrome with unspecified morphologic changes: N05.9

## 2019-08-03 HISTORY — DX: Low back pain, unspecified: M54.50

## 2019-08-03 HISTORY — DX: Hyperlipidemia, unspecified: E78.5

## 2019-08-03 LAB — SARS CORONAVIRUS 2 (TAT 6-24 HRS): SARS Coronavirus 2: NEGATIVE

## 2019-08-03 NOTE — Patient Instructions (Signed)
Your procedure is scheduled on: Tues 11.24 Report to Day Surgery. To find out your arrival time please call [phone] between 1PM - 3PM on Mon 11/23.  Remember: Instructions that are not followed completely may result in serious medical risk,  up to and including death, or upon the discretion of your surgeon and anesthesiologist your  surgery may need to be rescheduled.     _X__ 1. Do not eat food after midnight the night before your procedure.                 No gum chewing or hard candies. You may drink clear liquids up to 2 hours                 before you are scheduled to arrive for your surgery- DO not drink clear                 liquids within 2 hours of the start of your surgery.                 Clear Liquids include:  water, apple juice without pulp, clear carbohydrate                 drink such as Clearfast of Gatorade, Black Coffee or Tea (Do not add                 anything to coffee or tea).  __X__2.  On the morning of surgery brush your teeth with toothpaste and water, you                may rinse your mouth with mouthwash if you wish.  Do not swallow any toothpaste of mouthwash.     _X__ 3.  No Alcohol for 24 hours before or after surgery.   _X__ 4.  Do Not Smoke or use e-cigarettes For 24 Hours Prior to Your Surgery.                 Do not use any chewable tobacco products for at least 6 hours prior to                 surgery.  ____  5.  Bring all medications with you on the day of surgery if instructed.   __x__  6.  Notify your doctor if there is any change in your medical condition      (cold, fever, infections).     Do not wear jewelry, make-up, hairpins, clips or nail polish. Do not wear lotions, powders, or perfumes. You may wear deodorant. Do not shave 48 hours prior to surgery. Men may shave face and neck. Do not bring valuables to the hospital.    [facility] is not responsible for any belongings or valuables.  Contacts, dentures  or bridgework may not be worn into surgery. Leave your suitcase in the car. After surgery it may be brought to your room. For patients admitted to the hospital, discharge time is determined by your treatment team.   Patients discharged the day of surgery will not be allowed to drive home.   Please read over the following fact sheets that you were given:     ____ Take these medicines the morning of surgery with A SIP OF WATER:    1. None  2.   3.   4.  5.  6.  ____ Fleet Enema (as directed)   ____ Use CHG Soap as directed  ____ Use inhalers on the day  of surgery  ____ Stop metformin 2 days prior to surgery    ____ Take 1/2 of usual insulin dose the night before surgery. No insulin the morning          of surgery.   ____ Stop Coumadin/Plavix/aspirin on   ____ Stop Anti-inflammatories on    __x__ Stop supplements until after surgery.  Coenzyme Q10 100 MG capsule today  ____ Bring C-Pap to the hospital.

## 2019-08-07 ENCOUNTER — Ambulatory Visit: Payer: Medicare Other | Admitting: Anesthesiology

## 2019-08-07 ENCOUNTER — Encounter
Admission: RE | Disposition: A | Discharge status: Home / Self Care | Payer: Self-pay | Source: Home / Self Care | Attending: Unknown Physician Specialty

## 2019-08-07 ENCOUNTER — Ambulatory Visit
Admission: RE | Admit: 2019-08-07 | Discharge: 2019-08-07 | Disposition: A | Discharge status: Home / Self Care | Payer: Medicare Other | Attending: Unknown Physician Specialty | Admitting: Unknown Physician Specialty

## 2019-08-07 ENCOUNTER — Other Ambulatory Visit: Payer: Self-pay

## 2019-08-07 DIAGNOSIS — D37030 Neoplasm of uncertain behavior of the parotid salivary glands: Secondary | ICD-10-CM | POA: Diagnosis present

## 2019-08-07 DIAGNOSIS — Z9049 Acquired absence of other specified parts of digestive tract: Secondary | ICD-10-CM | POA: Diagnosis not present

## 2019-08-07 DIAGNOSIS — Z880 Allergy status to penicillin: Secondary | ICD-10-CM | POA: Insufficient documentation

## 2019-08-07 DIAGNOSIS — Z85038 Personal history of other malignant neoplasm of large intestine: Secondary | ICD-10-CM | POA: Diagnosis not present

## 2019-08-07 DIAGNOSIS — Z79899 Other long term (current) drug therapy: Secondary | ICD-10-CM | POA: Insufficient documentation

## 2019-08-07 DIAGNOSIS — D110 Benign neoplasm of parotid gland: Secondary | ICD-10-CM | POA: Diagnosis not present

## 2019-08-07 DIAGNOSIS — F17210 Nicotine dependence, cigarettes, uncomplicated: Secondary | ICD-10-CM | POA: Insufficient documentation

## 2019-08-07 DIAGNOSIS — Z7982 Long term (current) use of aspirin: Secondary | ICD-10-CM | POA: Insufficient documentation

## 2019-08-07 DIAGNOSIS — E785 Hyperlipidemia, unspecified: Secondary | ICD-10-CM | POA: Insufficient documentation

## 2019-08-07 DIAGNOSIS — Z1211 Encounter for screening for malignant neoplasm of colon: Secondary | ICD-10-CM

## 2019-08-07 DIAGNOSIS — Z882 Allergy status to sulfonamides status: Secondary | ICD-10-CM | POA: Insufficient documentation

## 2019-08-07 DIAGNOSIS — Z888 Allergy status to other drugs, medicaments and biological substances status: Secondary | ICD-10-CM | POA: Insufficient documentation

## 2019-08-07 HISTORY — PX: PAROTIDECTOMY: SHX2163

## 2019-08-07 SURGERY — EXCISION, PAROTID GLAND
Anesthesia: General | Laterality: Right

## 2019-08-07 MED ORDER — FENTANYL CITRATE (PF) 100 MCG/2ML IJ SOLN
INTRAMUSCULAR | Status: AC
  Administered 2019-08-07: 25 ug via INTRAVENOUS
  Filled 2019-08-07: qty 2

## 2019-08-07 MED ORDER — PROPOFOL 10 MG/ML IV BOLUS
INTRAVENOUS | Status: DC | PRN
  Administered 2019-08-07: 100 mg via INTRAVENOUS

## 2019-08-07 MED ORDER — FENTANYL CITRATE (PF) 100 MCG/2ML IJ SOLN
INTRAMUSCULAR | Status: AC
  Filled 2019-08-07: qty 2

## 2019-08-07 MED ORDER — PROPOFOL 500 MG/50ML IV EMUL
INTRAVENOUS | Status: DC | PRN
  Administered 2019-08-07: 75 ug/kg/min via INTRAVENOUS

## 2019-08-07 MED ORDER — LIDOCAINE-EPINEPHRINE 1 %-1:100000 IJ SOLN
INTRAMUSCULAR | Status: AC
  Filled 2019-08-07: qty 1

## 2019-08-07 MED ORDER — LIDOCAINE-EPINEPHRINE 1 %-1:100000 IJ SOLN
INTRAMUSCULAR | Status: DC | PRN
  Administered 2019-08-07: 4 mL

## 2019-08-07 MED ORDER — FENTANYL CITRATE (PF) 100 MCG/2ML IJ SOLN
25.0000 ug | INTRAMUSCULAR | Status: AC | PRN
  Administered 2019-08-07 (×2): 25 ug via INTRAVENOUS

## 2019-08-07 MED ORDER — PHENYLEPHRINE HCL (PRESSORS) 10 MG/ML IV SOLN
INTRAVENOUS | Status: DC | PRN
  Administered 2019-08-07 (×2): 80 ug via INTRAVENOUS

## 2019-08-07 MED ORDER — SUCCINYLCHOLINE CHLORIDE 20 MG/ML IJ SOLN
INTRAMUSCULAR | Status: DC | PRN
  Administered 2019-08-07: 80 mg via INTRAVENOUS

## 2019-08-07 MED ORDER — FENTANYL CITRATE (PF) 100 MCG/2ML IJ SOLN
25.0000 ug | INTRAMUSCULAR | Status: AC | PRN
  Administered 2019-08-07 (×6): 25 ug via INTRAVENOUS

## 2019-08-07 MED ORDER — FAMOTIDINE 20 MG PO TABS
20.0000 mg | ORAL_TABLET | Freq: Once | ORAL | Status: AC
  Administered 2019-08-07: 06:00:00 20 mg via ORAL

## 2019-08-07 MED ORDER — SEVOFLURANE IN SOLN
RESPIRATORY_TRACT | Status: AC
  Filled 2019-08-07: qty 250

## 2019-08-07 MED ORDER — ONDANSETRON HCL 4 MG/2ML IJ SOLN
INTRAMUSCULAR | Status: AC
  Administered 2019-08-07: 4 mg via INTRAVENOUS
  Filled 2019-08-07: qty 2

## 2019-08-07 MED ORDER — MIDAZOLAM HCL 2 MG/2ML IJ SOLN
INTRAMUSCULAR | Status: AC
  Filled 2019-08-07: qty 2

## 2019-08-07 MED ORDER — SODIUM CHLORIDE FLUSH 0.9 % IV SOLN
INTRAVENOUS | Status: AC
  Filled 2019-08-07: qty 10

## 2019-08-07 MED ORDER — MIDAZOLAM HCL 2 MG/2ML IJ SOLN
INTRAMUSCULAR | Status: DC | PRN
  Administered 2019-08-07: .5 mg via INTRAVENOUS

## 2019-08-07 MED ORDER — PHENYLEPHRINE HCL (PRESSORS) 10 MG/ML IV SOLN
INTRAVENOUS | Status: AC
  Filled 2019-08-07: qty 1

## 2019-08-07 MED ORDER — OXYCODONE HCL 5 MG PO TABS
ORAL_TABLET | ORAL | Status: AC
  Administered 2019-08-07: 11:00:00 5 mg via ORAL
  Filled 2019-08-07: qty 1

## 2019-08-07 MED ORDER — LACTATED RINGERS IV SOLN
INTRAVENOUS | Status: DC
  Administered 2019-08-07: 07:00:00 50 mL/h via INTRAVENOUS
  Administered 2019-08-07: 08:00:00 via INTRAVENOUS

## 2019-08-07 MED ORDER — FAMOTIDINE 20 MG PO TABS
ORAL_TABLET | ORAL | Status: AC
  Administered 2019-08-07: 06:00:00 20 mg via ORAL
  Filled 2019-08-07: qty 1

## 2019-08-07 MED ORDER — REMIFENTANIL HCL 1 MG IV SOLR
INTRAVENOUS | Status: DC | PRN
  Administered 2019-08-07 (×2): .1 ug/kg/min via INTRAVENOUS

## 2019-08-07 MED ORDER — ONDANSETRON HCL 4 MG/2ML IJ SOLN
4.0000 mg | Freq: Once | INTRAMUSCULAR | Status: AC | PRN
  Administered 2019-08-07: 10:00:00 4 mg via INTRAVENOUS

## 2019-08-07 MED ORDER — PROMETHAZINE HCL 25 MG/ML IJ SOLN
6.2500 mg | Freq: Once | INTRAMUSCULAR | Status: AC
  Administered 2019-08-07: 11:00:00 6.25 mg via INTRAVENOUS

## 2019-08-07 MED ORDER — PROPOFOL 10 MG/ML IV BOLUS
INTRAVENOUS | Status: AC
  Filled 2019-08-07: qty 20

## 2019-08-07 MED ORDER — OXYCODONE HCL 5 MG PO TABS
5.0000 mg | ORAL_TABLET | Freq: Once | ORAL | Status: AC
  Administered 2019-08-07: 11:00:00 5 mg via ORAL

## 2019-08-07 MED ORDER — SUGAMMADEX SODIUM 200 MG/2ML IV SOLN
INTRAVENOUS | Status: DC | PRN
  Administered 2019-08-07: 175 mg via INTRAVENOUS

## 2019-08-07 MED ORDER — ROCURONIUM BROMIDE 100 MG/10ML IV SOLN
INTRAVENOUS | Status: DC | PRN
  Administered 2019-08-07: 10 mg via INTRAVENOUS

## 2019-08-07 MED ORDER — FENTANYL CITRATE (PF) 100 MCG/2ML IJ SOLN
INTRAMUSCULAR | Status: AC
  Administered 2019-08-07: 10:00:00 25 ug via INTRAVENOUS
  Filled 2019-08-07: qty 2

## 2019-08-07 MED ORDER — PROPOFOL 500 MG/50ML IV EMUL
INTRAVENOUS | Status: AC
  Filled 2019-08-07: qty 50

## 2019-08-07 MED ORDER — PROMETHAZINE HCL 25 MG/ML IJ SOLN
INTRAMUSCULAR | Status: AC
  Administered 2019-08-07: 11:00:00 6.25 mg via INTRAVENOUS
  Filled 2019-08-07: qty 1

## 2019-08-07 MED ORDER — CLINDAMYCIN HCL 300 MG PO CAPS: 300.0000 mg | ORAL_CAPSULE | Freq: Three times a day (TID) | ORAL | 0 refills | Status: AC

## 2019-08-07 MED ORDER — HYDROCODONE-ACETAMINOPHEN 5-300 MG PO TABS: 1.0000 | ORAL_TABLET | ORAL | 0 refills | Status: AC | PRN

## 2019-08-07 MED ORDER — REMIFENTANIL HCL 1 MG IV SOLR
INTRAVENOUS | Status: AC
  Filled 2019-08-07: qty 1000

## 2019-08-07 MED ORDER — FENTANYL CITRATE (PF) 100 MCG/2ML IJ SOLN
INTRAMUSCULAR | Status: DC | PRN
  Administered 2019-08-07: 50 ug via INTRAVENOUS
  Administered 2019-08-07: 25 ug via INTRAVENOUS
  Administered 2019-08-07: 50 ug via INTRAVENOUS
  Administered 2019-08-07: 25 ug via INTRAVENOUS
  Administered 2019-08-07: 50 ug via INTRAVENOUS

## 2019-08-07 SURGICAL SUPPLY — 49 items
ADH LQ OCL WTPRF AMP STRL LF (MISCELLANEOUS) ×1
ADH SKN CLS APL DERMABOND .7 (GAUZE/BANDAGES/DRESSINGS) ×1
ADHESIVE MASTISOL STRL (MISCELLANEOUS) ×3 IMPLANT
BLADE SURG 15 STRL LF DISP TIS (BLADE) ×1 IMPLANT
BLADE SURG 15 STRL SS (BLADE) ×3
BULB RESERV EVAC DRAIN JP 100C (MISCELLANEOUS) ×2 IMPLANT
CANISTER SUCT 1200ML W/VALVE (MISCELLANEOUS) ×3 IMPLANT
CORD BIP STRL DISP 12FT (MISCELLANEOUS) ×3 IMPLANT
COVER WAND RF STERILE (DRAPES) ×3 IMPLANT
DERMABOND ADVANCED (GAUZE/BANDAGES/DRESSINGS) ×2
DERMABOND ADVANCED .7 DNX12 (GAUZE/BANDAGES/DRESSINGS) ×1 IMPLANT
DRAIN JP 10F RND SILICONE (MISCELLANEOUS) ×2 IMPLANT
DRAIN TLS ROUND 10FR (DRAIN) ×1 IMPLANT
DRAPE MAG INST 16X20 L/F (DRAPES) ×3 IMPLANT
DRAPE SURG 17X11 SM STRL (DRAPES) ×3 IMPLANT
DRSG TEGADERM 2-3/8X2-3/4 SM (GAUZE/BANDAGES/DRESSINGS) ×5 IMPLANT
ELECT CAUTERY BLADE TIP 2.5 (TIP) ×3
ELECT EMG 20MM DUAL (MISCELLANEOUS) ×6
ELECT NEEDLE 20X.3 GREEN (MISCELLANEOUS) ×3
ELECT REM PT RETURN 9FT ADLT (ELECTROSURGICAL) ×3
ELECTRODE CAUTERY BLDE TIP 2.5 (TIP) ×1 IMPLANT
ELECTRODE EMG 20MM DUAL (MISCELLANEOUS) ×2 IMPLANT
ELECTRODE NDL 20X.3 GREEN (MISCELLANEOUS) ×1 IMPLANT
ELECTRODE NEEDLE 20X.3 GREEN (MISCELLANEOUS) ×1 IMPLANT
ELECTRODE REM PT RTRN 9FT ADLT (ELECTROSURGICAL) ×1 IMPLANT
FORCEPS JEWEL BIP 4-3/4 STR (INSTRUMENTS) ×3 IMPLANT
GAUZE 4X4 16PLY RFD (DISPOSABLE) ×7 IMPLANT
GLOVE BIO SURGEON STRL SZ7.5 (GLOVE) ×6 IMPLANT
GOWN STRL REUS W/ TWL LRG LVL3 (GOWN DISPOSABLE) ×2 IMPLANT
GOWN STRL REUS W/TWL LRG LVL3 (GOWN DISPOSABLE) ×6
HEMOSTAT SURGICEL 2X3 (HEMOSTASIS) ×3 IMPLANT
HOOK STAY BLUNT/RETRACTOR 5M (MISCELLANEOUS) ×3 IMPLANT
KIT TURNOVER KIT A (KITS) ×3 IMPLANT
LABEL OR SOLS (LABEL) ×3 IMPLANT
MARKER SKIN DUAL TIP RULER LAB (MISCELLANEOUS) ×3 IMPLANT
NS IRRIG 500ML POUR BTL (IV SOLUTION) ×3 IMPLANT
PACK HEAD/NECK (MISCELLANEOUS) ×3 IMPLANT
PROBE MONO 100X0.75 ELECT 1.9M (MISCELLANEOUS) ×3 IMPLANT
SHEARS HARMONIC 9CM CVD (BLADE) ×3 IMPLANT
SPONGE KITTNER 5P (MISCELLANEOUS) ×7 IMPLANT
STAPLER SKIN PROX 35W (STAPLE) ×3 IMPLANT
SUT NYLON 2-0 (SUTURE) ×2 IMPLANT
SUT PROLENE 5 0 PS 3 (SUTURE) ×3 IMPLANT
SUT SILK 2 0 (SUTURE) ×3
SUT SILK 2-0 30XBRD TIE 12 (SUTURE) ×1 IMPLANT
SUT SILK 3 0 (SUTURE) ×3
SUT SILK 3-0 18XBRD TIE 12 (SUTURE) ×1 IMPLANT
SUT VIC AB 4-0 RB1 18 (SUTURE) ×8 IMPLANT
SYSTEM CHEST DRAIN TLS 7FR (DRAIN) ×1 IMPLANT

## 2019-08-07 NOTE — Transfer of Care (Signed)
Immediate Anesthesia Transfer of Care Note  Patient: Shelby Stone  Procedure(s) Performed: SUPERFICIAL PAROTIDECTOMY (Right )  Patient Location: PACU  Anesthesia Type:General  Level of Consciousness: awake and alert   Airway & Oxygen Therapy: Patient Spontanous Breathing and Patient connected to face mask oxygen  Post-op Assessment: Report given to RN and Post -op Vital signs reviewed and stable  Post vital signs: Reviewed and stable  Last Vitals:  Vitals Value Taken Time  BP 179/89 08/07/19 0955  Temp 36.9 C 08/07/19 0955  Pulse 92 08/07/19 0959  Resp 21 08/07/19 0959  SpO2 99 % 08/07/19 0959  Vitals shown include unvalidated device data.  Last Pain:  Vitals:   08/07/19 0620  TempSrc: Tympanic  PainSc: 0-No pain         Complications: No apparent anesthesia complications

## 2019-08-07 NOTE — Anesthesia Preprocedure Evaluation (Signed)
Anesthesia Evaluation  Patient identified by MRN, date of birth, ID band Patient awake    Reviewed: Allergy & Precautions, NPO status , Patient's Chart, lab work & pertinent test results  History of Anesthesia Complications Negative for: history of anesthetic complications  Airway Mallampati: II       Dental  (+) Teeth Intact, Dental Advidsory Given, Caps   Pulmonary neg shortness of breath, COPD, neg recent URI, Current Smoker,     + decreased breath sounds      Cardiovascular Exercise Tolerance: Good negative cardio ROS   Rhythm:Regular     Neuro/Psych negative neurological ROS     GI/Hepatic negative GI ROS, Neg liver ROS,   Endo/Other  negative endocrine ROS  Renal/GU negative Renal ROS     Musculoskeletal negative musculoskeletal ROS (+)   Abdominal Normal abdominal exam  (+)   Peds  Hematology negative hematology ROS (+)   Anesthesia Other Findings Past Medical History: No date: Breast screening, unspecified 2010: Cystitis No date: Diverticulitis No date: Hyperlipidemia No date: Low back pain 2003: 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 No date: Nephritis No date: Personal history of tobacco use, presenting hazards to health No date: Special screening for malignant neoplasms, colon   Reproductive/Obstetrics negative OB ROS                             Anesthesia Physical  Anesthesia Plan  ASA: II  Anesthesia Plan: General   Post-op Pain Management:    Induction: Intravenous  PONV Risk Score and Plan: 2 and Ondansetron, Dexamethasone and Treatment may vary due to age or medical condition  Airway Management Planned: Oral ETT  Additional Equipment:   Intra-op Plan:   Post-operative Plan: Extubation in OR  Informed Consent: I have reviewed the patients History and Physical, chart, labs and  discussed the procedure including the risks, benefits and alternatives for the proposed anesthesia with the patient or authorized representative who has indicated his/her understanding and acceptance.       Plan Discussed with: Surgeon  Anesthesia Plan Comments:         Anesthesia Quick Evaluation

## 2019-08-07 NOTE — OR Nursing (Signed)
Patient desires  discharge. Patient and dughter instructed on JP care with return demonstration.

## 2019-08-07 NOTE — Anesthesia Post-op Follow-up Note (Signed)
Anesthesia QCDR form completed.        

## 2019-08-07 NOTE — Op Note (Signed)
08/07/2019  9:46 AM    Shelby Stone  NE:[id number]   Pre-Op Dx: NEOPLASM UNCERTAIN BEHAVIOR, RIGHT PAROTID GLAND  Post-op Dx: SAME  Proc: Right superficial parotidectomy; facial nerve monitoring 2 hours  Surg:  Roena Malady   Asst: Vaught  Anes:  GOT  EBL: Less than 20 cc  Comp: None  Findings: Multicystic mass right superficial lobe parotid  Procedure: Shelby Stone was identified in the holding area take the operating placed in supine position.  After general trach anesthesia the table was turned 90 degrees.  The facial nerve monitor was applied to the right face and remained on throughout the procedure.  An incision line was marked along the natural skin crease in the preauricular region on the right down onto the upper neck.  Local anesthetic of 1% lidocaine with 1 to 100,000 units of epinephrine is used to inject along the incision line a total of 4 cc was used.  The right neck and face were then prepped and draped sterilely.  15 blade was used to incise down to the superficial muscular aponeurotic layer.  Hemostasis achieved using the Bovie cautery the supra SMAS flap was elevated anteriorly onto the face.  The mass was easily identifiable and the infra-auricular region and was adherent to the skin subcutaneous tissues were gently peeled off and with this close adherence the cystic mass was entered there was mucopurulent debris which was suctioned free.  The SMAS flap was then elevated anteriorly onto the neck and posteriorly over the sternocleidomastoid.  Retractors were then placed.  The tragal cartilage was identified and a pretragal plane was developed tween the parotid and the tragus.  This was taken down in a joint to the sternocleidomastoid muscle as the superficial lobe of the parotid was retracted anteriorly there was also cystic area of the parotid posteriorly which was retracted anteriorly.  The facial nerve was identified as it left the skull base this was then traced  anteriorly and the superficial probe of the parotid was removed from the facial nerve using the harmonic scalpel.  All branches of the nerve were identified stimulated and remained intact throughout the procedure.  The gland was then gently dissected off of the nerve using the curved hemostat and the harmonic scalpel.  As the tumor was lifted anteriorly the anterior aspect of the tumor was identified and the gland was removed dividing this mass anterior to the tumor.  With the tumor removed in its entirety the wound was copiously irrigated with saline any bleeding spots were cauterized using the microbipolar.  The nerve was stimulated at the end of the case and all branches were intact and stimulated.  The SMAS was then reapproximated posteriorly using 4-0 Vicryl the subcutaneous tissues were closed using 4-0 Vicryl a #10 TLS drain was brought out the wound inferiorly and the final skin layer was closed using Dermabond a 4-0 nylon was used as a drain stitch.  The patient was then awakened in the operating room and taken care room in stable condition.  Cultures: None  Specimen: Right superficial lobe parotid   Dispo:   Good  Plan: Discharged home I will see her Friday as an outpatient in the clinic to remove the drain.  Roena Malady  08/07/2019 9:46 AM

## 2019-08-07 NOTE — Anesthesia Procedure Notes (Signed)
Procedure Name: Intubation Date/Time: 08/07/2019 7:50 AM Performed by: Allean Found, CRNA Pre-anesthesia Checklist: Patient identified, Patient being monitored, Timeout performed, Emergency Drugs available and Suction available Patient Re-evaluated:Patient Re-evaluated prior to induction Oxygen Delivery Method: Circle system utilized Preoxygenation: Pre-oxygenation with 100% oxygen Induction Type: IV induction Ventilation: Mask ventilation without difficulty Laryngoscope Size: 3 and McGraph Grade View: Grade II Tube type: Oral Tube size: 7.0 mm Number of attempts: 1 Airway Equipment and Method: Stylet Placement Confirmation: ETT inserted through vocal cords under direct vision,  positive ETCO2 and breath sounds checked- equal and bilateral Secured at: 21 cm Tube secured with: Tape Dental Injury: Teeth and Oropharynx as per pre-operative assessment

## 2019-08-07 NOTE — H&P (Signed)
The patient's history has been reviewed, patient examined, no change in status, stable for surgery.  Questions were answered to the patients satisfaction.  

## 2019-08-07 NOTE — Anesthesia Postprocedure Evaluation (Signed)
Anesthesia Post Note  Patient: Shelby Stone  Procedure(s) Performed: SUPERFICIAL PAROTIDECTOMY (Right )  Patient location during evaluation: PACU Anesthesia Type: General Level of consciousness: awake and alert Pain management: pain level controlled Vital Signs Assessment: post-procedure vital signs reviewed and stable Respiratory status: spontaneous breathing, nonlabored ventilation, respiratory function stable and patient connected to nasal cannula oxygen Cardiovascular status: blood pressure returned to baseline and stable Postop Assessment: no apparent nausea or vomiting Anesthetic complications: no     Last Vitals:  Vitals:   08/07/19 1236 08/07/19 1312  BP: 130/64 133/66  Pulse: 77 75  Resp: 16 16  Temp: 36.7 C 36.8 C  SpO2: 96% 96%    Last Pain:  Vitals:   08/07/19 1312  TempSrc: Temporal  PainSc: 3                  Martha Clan

## 2019-08-07 NOTE — Discharge Instructions (Signed)
Parotidectomy, Care After This sheet gives you information about how to care for yourself after your procedure. Your health care provider may also give you more specific instructions. If you have problems or questions, contact your health care provider. What can I expect after the procedure? After the procedure, it is common to have:  Pain and mild swelling at the incision site.  Numbness along the incision.  Numbness in part or all of your ear.  Mild jaw discomfort on the surgical side when you are eating or chewing. This may last up to 2-4 weeks. Follow these instructions at home: Medicines   Take over-the-counter and prescription medicines only as told by your health care provider.  If you were prescribed an antibiotic medicine, take it as told by your health care provider. Do not stop taking the antibiotic even if you start to feel better.  Ask your health care provider if the medicine prescribed to you: ? Requires you to avoid driving or using heavy machinery. ? Can cause constipation. You may need to take actions to prevent or treat constipation, such as:  Drink enough fluid to keep your urine pale yellow.  Take over-the-counter or prescription medicines.  Eat foods that are high in fiber, such as beans, whole grains, and fresh fruits and vegetables.  Limit foods that are high in fat and processed sugars, such as fried or sweet foods. Incision care   Follow instructions from your health care provider about how to take care of your incision. Make sure you: ? Wash your hands with soap and water before and after you change your bandage (dressing). If soap and water are not available, use hand sanitizer. ? Change your dressing as told by your health care provider. ? Leave stitches (sutures), skin glue, or adhesive strips in place. These skin closures may need to be in place for 2 weeks or longer. If adhesive strip edges start to loosen and curl up, you may trim the loose edges.  Do not remove adhesive strips completely unless your health care provider tells you to do that.  Check your incision area every day for signs of infection. Check for: ? More redness, swelling, or pain. ? Fluid or blood. ? Warmth. ? Pus or a bad smell.  Follow your health care provider's instructions about cleaning and maintaining the drain that was placed near your incision. Eating and drinking  Follow instructions from your health care provider about eating or drinking restrictions.  If your mouth or jaw is sore, try eating soft foods until you feel better. Activity  Return to your normal activities as told by your health care provider. Ask your health care provider what activities are safe for you.  Rest as told by your health care provider.  Avoid sitting for a long time without moving. Get up to take short walks every 1-2 hours. This is important to improve blood flow and breathing. Ask for help if you feel weak or unsteady.  Do not lift anything that is heavier than 10 lb (4.5 kg), or the limit that you are told, until your health care provider says that it is safe. General instructions  Keep your head raised (elevated) when you lie down during the first few weeks after surgery. This will help prevent increased swelling.  Do not take baths, swim, or use a hot tub until your health care provider approves. Ask your health care provider if you may take showers. You may only be allowed to take sponge baths.  Keep all follow-up visits as told by your health care provider. This is important. Contact a health care provider if:  You have pain that does not get better with medicine.  You have more redness, swelling, or pain around your incision.  You have fluid or blood coming from your incision.  Your incision feels warm to the touch.  You have pus or a bad smell coming from your incision.  You vomit or feel nauseous.  You have a fever. Get help right away if:  You have  more pain, swelling, or redness that suddenly gets worse at the incision site.  You have increasing numbness or weakness in your face.  You have severe pain. Summary  After the procedure, it is common to have mild jaw discomfort on the surgical side when you are eating or chewing. This may last up to 2-4 weeks.  Follow instructions from your health care provider about how to take care of your incision.  If your mouth or jaw is sore, try eating soft foods until you feel better.  Return to your normal activities as told by your health care provider. Ask your health care provider what activities are safe for you. This information is not intended to replace advice given to you by your health care provider. Make sure you discuss any questions you have with your health care provider. Document Released: 10/02/2010 Document Revised: 06/19/2018 Document Reviewed: 06/21/2018 Elsevier Patient Education  2020 [place]   1) The drugs that you were given will stay in your system until tomorrow so for the next 24 hours you should not:  A) Drive an automobile B) Make any legal decisions C) Drink any alcoholic beverage   2) You may resume regular meals tomorrow.  Today it is better to start with liquids and gradually work up to solid foods.  You may eat anything you prefer, but it is better to start with liquids, then soup and crackers, and gradually work up to solid foods.   3) Please notify your doctor immediately if you have any unusual bleeding, trouble breathing, redness and pain at the surgery site, drainage, fever, or pain not relieved by medication.    4) Additional Instructions:        Please contact your physician with any problems or Same Day Surgery at [phone], Monday through Friday 6 am to 4 pm, or [place] [facility] number at [phone].

## 2019-08-08 ENCOUNTER — Encounter: Payer: Self-pay | Admitting: Unknown Physician Specialty

## 2019-08-10 LAB — SURGICAL PATHOLOGY

## 2020-12-25 IMAGING — US IR BIOPSY CORE SALIVARY GLAND
1 series · 13 of 20 positions shown · non-contrast
Comparison: none

CLINICAL DATA: Bilateral parotid masses, right greater than left.

[Series 1: ir biopsy core salivary gland · 0.05mm/px · 13 of 20 slices shown]
[im 1/20]
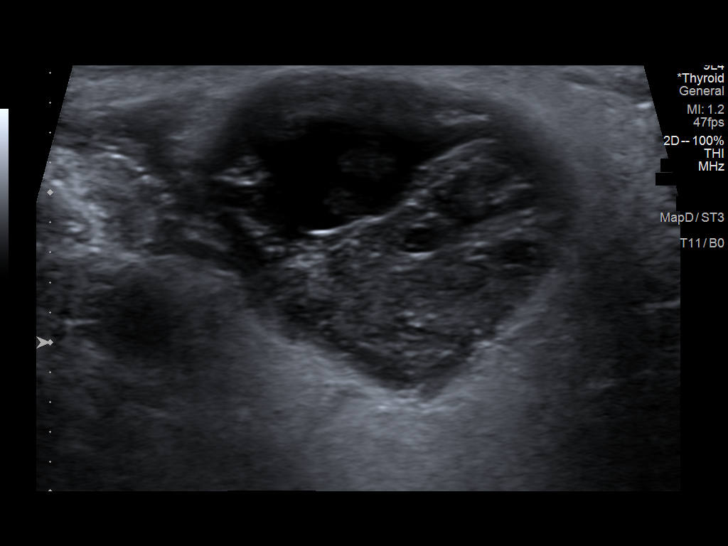
[im 3/20]
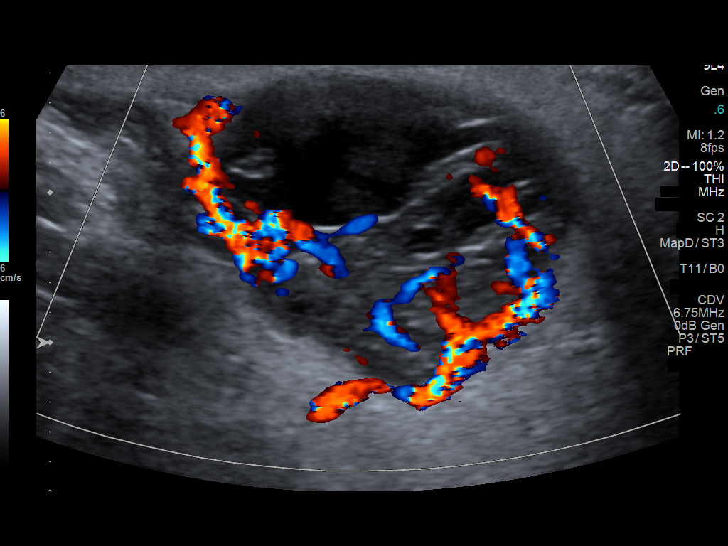
[im 4/20]
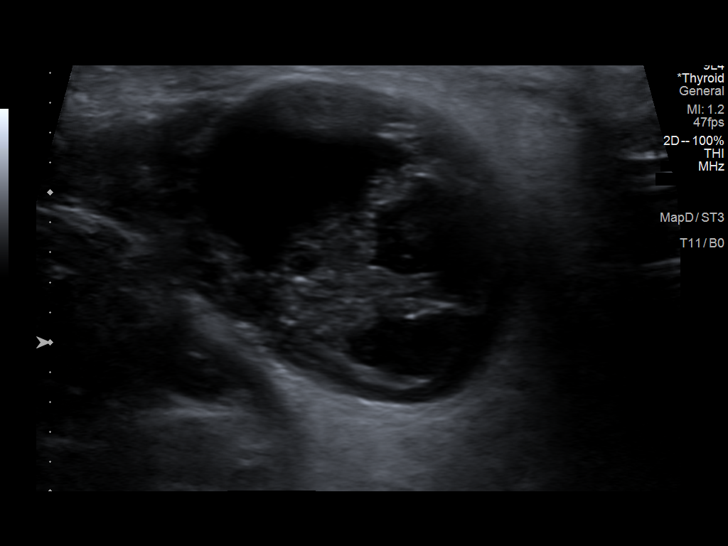
[im 6/20]
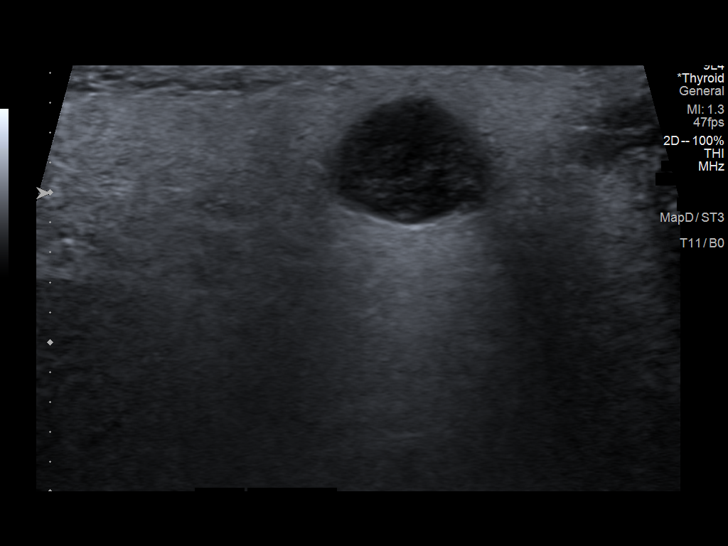
[im 7/20]
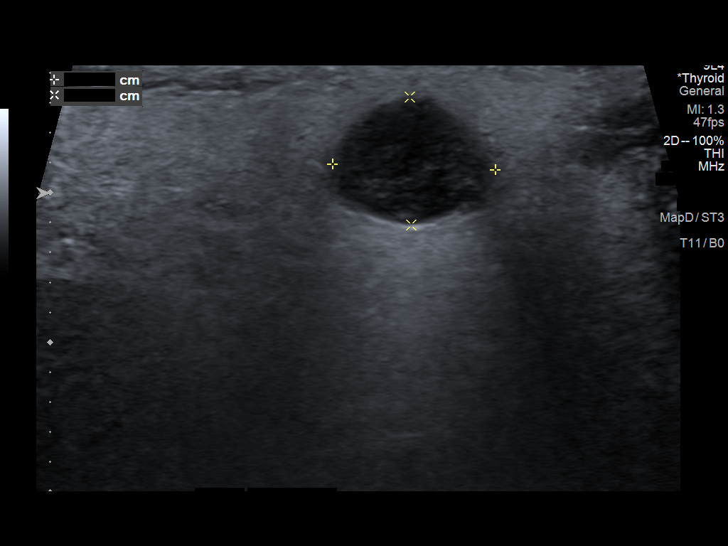
[im 9/20]
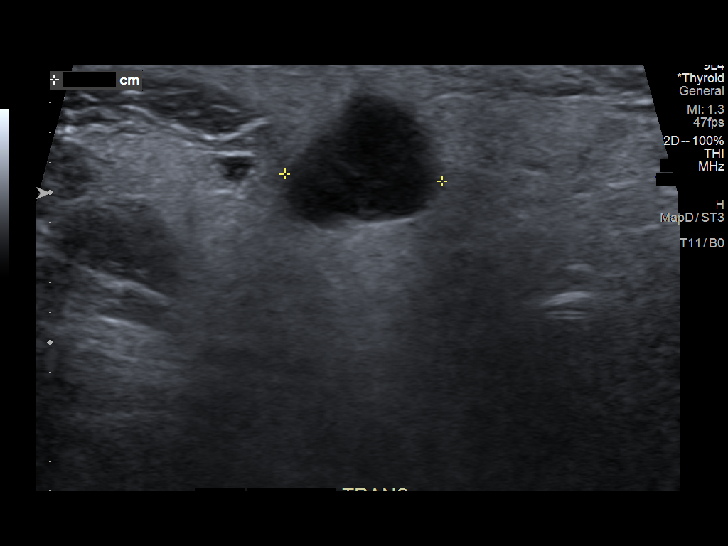
[im 11/20]
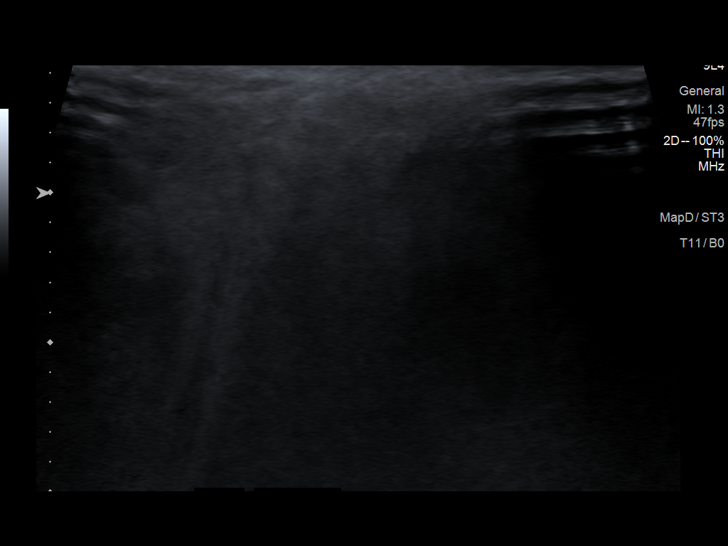
[im 12/20]
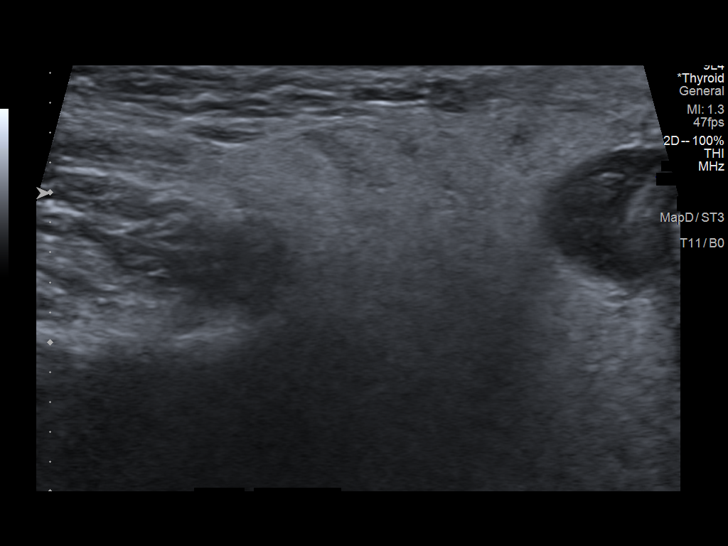
[im 14/20]
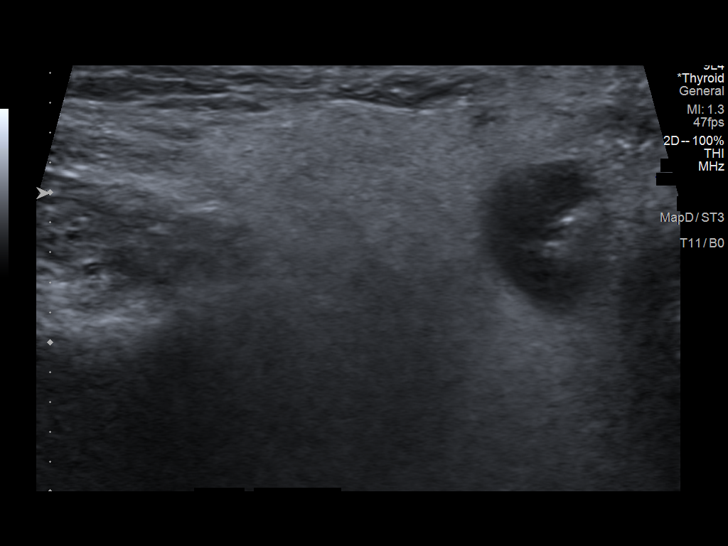
[im 15/20]
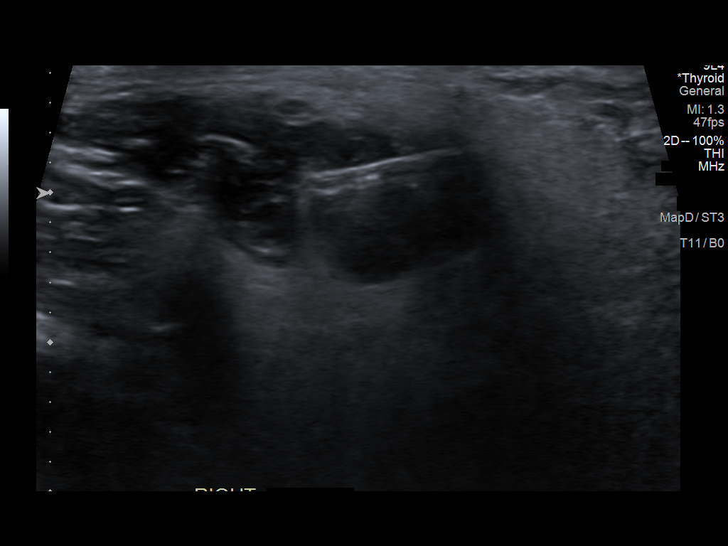
[im 17/20]
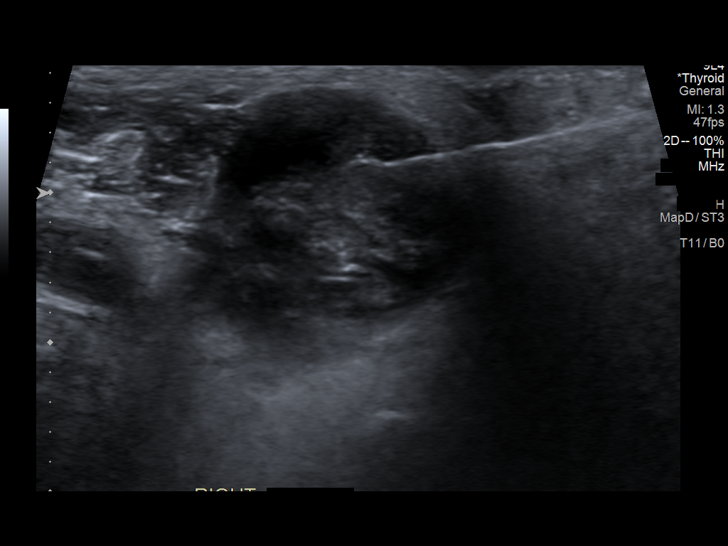
[im 18/20]
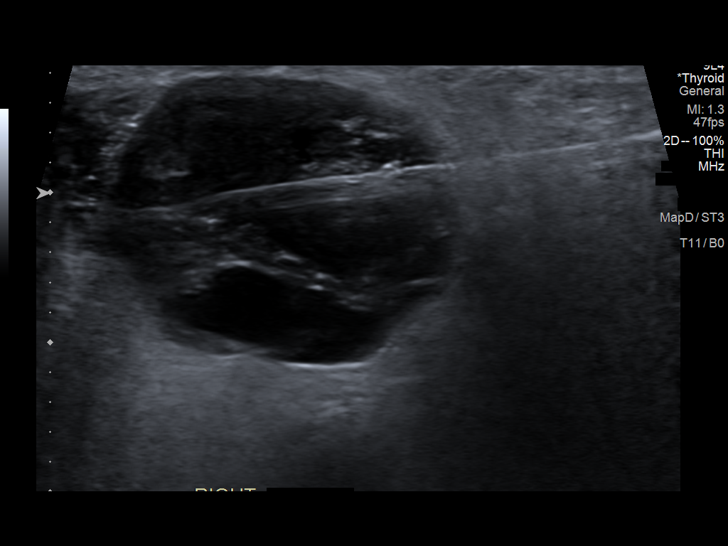
[im 20/20]
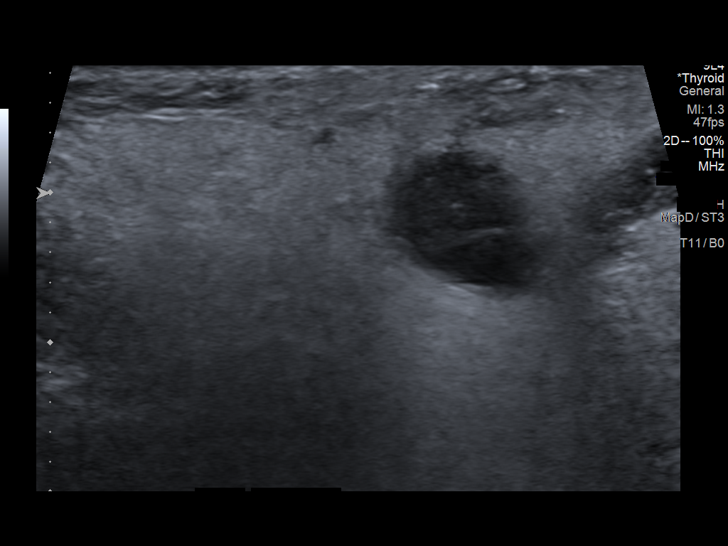

[13 of 20 positions shown; findings below may reference images not displayed]

EXAM:
ULTRASOUND GUIDED CORE BIOPSY OF LEFT PAROTID MASS

ULTRASOUND GUIDED CORE BIOPSY OF RIGHT PAROTID MASS

MEDICATIONS:
Intravenous Fentanyl 77mcg and Versed 2mg were administered as
conscious sedation during continuous monitoring of the patient's
level of consciousness and physiological / cardiorespiratory status
by the radiology RN, with a total moderate sedation time of 22
minutes.

PROCEDURE:
The procedure, risks, benefits, and alternatives were explained to
the patient. Questions regarding the procedure were encouraged and
answered. The patient understands and consents to the procedure.

survey ultrasound of the parotid glands was performed and the
lesions were localized.

The operative fields were prepped with chlorhexidine in a sterile
fashion, and a sterile drape was applied covering the operative
fields. A sterile gown and sterile gloves were used for the
procedure. Local anesthesia was provided with 1% Lidocaine.

Under real-time ultrasound guidance, 18 gauge core biopsy samples of
the left parotid mass were obtained, submitted in formalin to
pathology.

In similar fashion, under real-time ultrasound guidance, 18 gauge
core biopsy samples of the right parotid mass were obtained with a
new needle and placed in formalin. In addition, approximately 1 mL
of viscous purulent appearing material were aspirated from the
complex lesion with an 18 gauge needle. These were submitted to
surgical pathology.

Postprocedure scans show no hemorrhage or other apparent
complication of biopsy sites. The patient tolerated the procedure
well.

COMPLICATIONS:
None.
FINDINGS: Hypoechoic left parotid mass was localized. Representative core
biopsy samples obtained.

Larger complex solid/cystic right parotid mass was localized.
Representative aspiration and core biopsy samples were obtained.
IMPRESSION: 1. Technically successful core biopsy, left parotid mass.
2. Technically successful core biopsy, right parotid mass.

## 2021-09-01 ENCOUNTER — Other Ambulatory Visit: Payer: Self-pay | Admitting: General Surgery

## 2021-09-03 LAB — SURGICAL PATHOLOGY

## 2022-12-30 ENCOUNTER — Encounter: Payer: Self-pay | Admitting: Surgery

## 2023-01-04 ENCOUNTER — Encounter: Payer: Self-pay | Admitting: Surgery

## 2023-01-05 ENCOUNTER — Other Ambulatory Visit: Payer: Self-pay

## 2023-01-05 ENCOUNTER — Encounter: Payer: Self-pay | Admitting: Surgery

## 2023-01-05 ENCOUNTER — Ambulatory Visit: Payer: Medicare Other | Admitting: Anesthesiology

## 2023-01-05 ENCOUNTER — Ambulatory Visit
Admission: RE | Admit: 2023-01-05 | Discharge: 2023-01-05 | Disposition: A | Discharge status: Home / Self Care | Payer: Medicare Other | Attending: Surgery | Admitting: Surgery

## 2023-01-05 ENCOUNTER — Encounter
Admission: RE | Disposition: A | Discharge status: Home / Self Care | Payer: Self-pay | Source: Home / Self Care | Attending: Surgery

## 2023-01-05 DIAGNOSIS — K644 Residual hemorrhoidal skin tags: Secondary | ICD-10-CM | POA: Diagnosis not present

## 2023-01-05 DIAGNOSIS — F17210 Nicotine dependence, cigarettes, uncomplicated: Secondary | ICD-10-CM | POA: Diagnosis not present

## 2023-01-05 DIAGNOSIS — Z1211 Encounter for screening for malignant neoplasm of colon: Secondary | ICD-10-CM | POA: Insufficient documentation

## 2023-01-05 DIAGNOSIS — E785 Hyperlipidemia, unspecified: Secondary | ICD-10-CM | POA: Diagnosis not present

## 2023-01-05 DIAGNOSIS — K640 First degree hemorrhoids: Secondary | ICD-10-CM | POA: Diagnosis not present

## 2023-01-05 DIAGNOSIS — D125 Benign neoplasm of sigmoid colon: Secondary | ICD-10-CM | POA: Diagnosis not present

## 2023-01-05 DIAGNOSIS — K5730 Diverticulosis of large intestine without perforation or abscess without bleeding: Secondary | ICD-10-CM | POA: Diagnosis not present

## 2023-01-05 DIAGNOSIS — Z85038 Personal history of other malignant neoplasm of large intestine: Secondary | ICD-10-CM | POA: Diagnosis not present

## 2023-01-05 HISTORY — PX: COLONOSCOPY WITH PROPOFOL: SHX5780

## 2023-01-05 SURGERY — COLONOSCOPY WITH PROPOFOL
Anesthesia: General

## 2023-01-05 MED ORDER — PROPOFOL 10 MG/ML IV BOLUS
INTRAVENOUS | Status: DC | PRN
  Administered 2023-01-05: 70 mg via INTRAVENOUS

## 2023-01-05 MED ORDER — PROPOFOL 500 MG/50ML IV EMUL
INTRAVENOUS | Status: DC | PRN
  Administered 2023-01-05: 140 ug/kg/min via INTRAVENOUS

## 2023-01-05 MED ORDER — PROPOFOL 1000 MG/100ML IV EMUL
INTRAVENOUS | Status: AC
  Filled 2023-01-05: qty 100

## 2023-01-05 MED ORDER — SODIUM CHLORIDE 0.9 % IV SOLN: INTRAVENOUS | Status: DC

## 2023-01-05 NOTE — Interval H&P Note (Signed)
History and Physical Interval Note:  01/05/2023 7:13 AM  Shelby Stone  has presented today for surgery, with the diagnosis of History of colon polyps Z86.010.  The various methods of treatment have been discussed with the patient and family. After consideration of risks, benefits and other options for treatment, the patient has consented to  Procedure(s): COLONOSCOPY WITH PROPOFOL (N/A) as a surgical intervention.  The patient's history has been reviewed, patient examined, no change in status, stable for surgery.  I have reviewed the patient's chart and labs.  Questions were answered to the patient's satisfaction.     Evey Mcmahan Tonna Boehringer

## 2023-01-05 NOTE — Anesthesia Preprocedure Evaluation (Signed)
Anesthesia Evaluation  Patient identified by MRN, date of birth, ID band Patient awake    Reviewed: Allergy & Precautions, NPO status , Patient's Chart, lab work & pertinent test results  History of Anesthesia Complications Negative for: history of anesthetic complications  Airway Mallampati: II  TM Distance: >3 FB Neck ROM: Full    Dental  (+) Partial Upper   Pulmonary neg sleep apnea, neg COPD, Current SmokerPatient did not abstain from smoking.   Pulmonary exam normal breath sounds clear to auscultation       Cardiovascular Exercise Tolerance: Good METS(-) hypertension(-) CAD and (-) Past MI negative cardio ROS (-) dysrhythmias  Rhythm:Regular Rate:Normal - Systolic murmurs    Neuro/Psych negative neurological ROS  negative psych ROS   GI/Hepatic ,neg GERD  ,,(+)     (-) substance abuse    Endo/Other  neg diabetes    Renal/GU negative Renal ROS     Musculoskeletal   Abdominal   Peds  Hematology   Anesthesia Other Findings Past Medical History: No date: Breast screening, unspecified 2010: Cystitis No date: Diverticulitis No date: Hyperlipidemia No date: Low back pain 2003: Malignant neoplasm of ascending colon     Comment:  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 No date: Nephritis No date: Personal history of tobacco use, presenting hazards to health No date: Special screening for malignant neoplasms, colon  Reproductive/Obstetrics                              Anesthesia Physical Anesthesia Plan  ASA: 2  Anesthesia Plan: General   Post-op Pain Management: Minimal or no pain anticipated   Induction: Intravenous  PONV Risk Score and Plan: 2 and Propofol infusion, TIVA and Ondansetron  Airway Management Planned: Nasal Cannula  Additional Equipment: None  Intra-op Plan:   Post-operative Plan:   Informed Consent: I have reviewed the patients History and  Physical, chart, labs and discussed the procedure including the risks, benefits and alternatives for the proposed anesthesia with the patient or authorized representative who has indicated his/her understanding and acceptance.     Dental advisory given  Plan Discussed with: CRNA and Surgeon  Anesthesia Plan Comments: (Discussed risks of anesthesia with patient, including possibility of difficulty with spontaneous ventilation under anesthesia necessitating airway intervention, PONV, and rare risks such as cardiac or respiratory or neurological events, and allergic reactions. Discussed the role of CRNA in patient's perioperative care. Patient understands. Patient counseled on benefits of smoking cessation, and increased perioperative risks associated with continued smoking. )         Anesthesia Quick Evaluation

## 2023-01-05 NOTE — Transfer of Care (Signed)
Immediate Anesthesia Transfer of Care Note  Patient: Shelby Stone  Procedure(s) Performed: COLONOSCOPY WITH PROPOFOL  Patient Location: PACU  Anesthesia Type:General  Level of Consciousness: awake, alert , and oriented  Airway & Oxygen Therapy: Patient Spontanous Breathing  Post-op Assessment: Report given to RN and Post -op Vital signs reviewed and stable  Post vital signs: Reviewed and stable  Last Vitals:  Vitals Value Taken Time  BP 123/74 01/05/23 0754  Temp    Pulse 70 01/05/23 0755  Resp 21 01/05/23 0755  SpO2 100 % 01/05/23 0755  Vitals shown include unvalidated device data.  Last Pain:  Vitals:   01/05/23 0654  TempSrc: Temporal  PainSc: 0-No pain         Complications: No notable events documented.

## 2023-01-05 NOTE — Anesthesia Postprocedure Evaluation (Signed)
Anesthesia Post Note  Patient: Shelby Stone  Procedure(s) Performed: COLONOSCOPY WITH PROPOFOL  Patient location during evaluation: PACU Anesthesia Type: General Level of consciousness: awake and alert Pain management: pain level controlled Vital Signs Assessment: post-procedure vital signs reviewed and stable Respiratory status: spontaneous breathing, nonlabored ventilation, respiratory function stable and patient connected to nasal cannula oxygen Cardiovascular status: blood pressure returned to baseline and stable Postop Assessment: no apparent nausea or vomiting Anesthetic complications: no   No notable events documented.   Last Vitals:  Vitals:   01/05/23 0654 01/05/23 0753  BP: (!) 153/87 123/74  Pulse: 81 71  Resp: 20 18  Temp: (!) 36.1 C (!) 36.1 C  SpO2: 100% 100%    Last Pain:  Vitals:   01/05/23 0753  TempSrc: Temporal  PainSc: 0-No pain                 Corinda Gubler

## 2023-01-05 NOTE — H&P (Signed)
Subjective:  CC: History of colon polyps [Z86.010]  HPI: Shelby Stone is a 72 y.o. female who returns for above. Hx of colon polyps, recommend return for repeat colonoscopy. No issues.  Past Medical History: has a past medical history of Cystitis (2010), Diverticulitis, Hyperlipidemia, Malignant neoplasm of ascending colon (CMS-HCC), and Nephritis.  Past Surgical History: has a past surgical history that includes Parotidectomy (Right, 08/07/2019); Colonoscopy (10/13/2016); colon surgery (2003); neck surgery (1999); Abdominal hysterectomy (1981); fistulectomy anal (2003); fistulectomy anal (2005); back surgery; Colonoscopy (2007); and Colonoscopy (2012).  Family History: family history includes Breast cancer (age of onset: 64) in her sister.  Social History: reports that she has been smoking cigarettes. She has a 20 pack-year smoking history. She has never used smokeless tobacco. She reports current alcohol use. She reports that she does not use drugs.  Current Medications: has a current medication list which includes the following prescription(s): cetirizine, cholecalciferol, co-enzyme q-10 (ubiquinone), magnesium oxide, multivitamin with minerals, simvastatin, cephalexin, meloxicam, and silver sulfadiazine.  Allergies: Allergies Allergen Reactions Penicillin Hives Rosuvastatin Other (See Comments) Leg cramps Sulfa (Sulfonamide Antibiotics) Rash  ROS: A 15 point review of systems was performed and pertinent positives and negatives noted in HPI  Objective:   BP (!) 174/91  Pulse 92  Ht 160 cm ( )  Wt 60.8 kg (134 lb)  BMI 23.74 kg/m  Constitutional : No distress, cooperative, alert Lymphatics/Throat: Supple with no lymphadenopathy Respiratory: Clear to auscultation bilaterally Cardiovascular: Regular rate and rhythm Gastrointestinal: Soft, non-tender, non-distended, no organomegaly. Musculoskeletal: Steady gait and movement Skin: Cool and moist Psychiatric: Normal  affect, non-agitated, not confused    LABS: N/a  RADS: N/a  Assessment:   History of colon polyps [Z86.010]  Plan:   1. History of colon polyps [Z86.010] R/b/a discussed. Risks include bleeding, perforation. Benefits include diagnostic, curative procedure if needed. Alternatives include continued observation. Pt verbalized understanding.  2. Patient has elected to proceed with surgical treatment. Procedure will be scheduled.  labs/images/medications/previous chart entries reviewed personally and relevant changes/updates noted above.   Electronically signed by Sung Amabile, DO at 11/16/2022 10:31 AM EST

## 2023-01-05 NOTE — Op Note (Signed)
[facility] Patient Name: Shelby Stone Procedure Date: 01/05/2023 7:06 AM MRN: [id number] Account #: [id number] Date of Birth: Jul 27, 1951 Admit Type: Outpatient Age: 72 Room: [facility] ENDO ROOM 1 Gender: Female Note Status: Finalized Instrument Name: Prentice Docker [id number] Procedure:             Colonoscopy Indications:           High risk colon cancer surveillance: Personal history                         of colonic polyps Providers:             Sung Amabile MD, MD Referring MD:          Jillene Bucks. Arlana Pouch, MD (Referring MD) Medicines:             Propofol per Anesthesia Complications:         No immediate complications. Procedure:             Pre-Anesthesia Assessment:                        - After reviewing the risks and benefits, the patient                         was deemed in satisfactory condition to undergo the                         procedure in an ambulatory setting.                        After obtaining informed consent, the colonoscope was                         passed under direct vision. Throughout the procedure,                         the patient's blood pressure, pulse, and oxygen                         saturations were monitored continuously. The                         Colonoscope was introduced through the anus and                         advanced to the the cecum, identified by the ileocecal                         valve. The colonoscopy was performed without                         difficulty. The patient tolerated the procedure well.                         The quality of the bowel preparation was good. Findings:      Skin tags were found on perianal exam.      A few small-mouthed diverticula were found in the sigmoid colon and       transverse colon.      A 3 mm polyp was found in the sigmoid  colon. The polyp was sessile.       Biopsies were taken with a cold forceps for histology. Estimated blood       loss was minimal.       Non-bleeding internal hemorrhoids were found. The hemorrhoids were Grade       I (internal hemorrhoids that do not prolapse). Impression:            - Perianal skin tags found on perianal exam.                        - No specimens collected. Recommendation:        - Await pathology results.                        - Discharge patient to home.                        - Resume previous diet.                        - Written discharge instructions were provided to the                         patient. Procedure Code(s):     --- Professional ---                        [phone], Colonoscopy, flexible; with biopsy, single or                         multiple Diagnosis Code(s):     --- Professional ---                        Z86.010, Personal history of colonic polyps                        K64.4, Residual hemorrhoidal skin tags CPT copyright 2022 [employer]. All rights reserved. The codes documented in this report are preliminary and upon coder review may  be revised to meet current compliance requirements. Dr. Harrie Foreman, MD Sung Amabile MD, MD 01/05/2023 7:54:14 AM This report has been signed electronically. Number of Addenda: 0 Note Initiated On: 01/05/2023 7:06 AM Scope Withdrawal Time: 0 hours 9 minutes 15 seconds  Total Procedure Duration: 0 hours 15 minutes 6 seconds  Estimated Blood Loss:  Estimated blood loss was minimal.      [facility]

## 2023-01-06 ENCOUNTER — Encounter: Payer: Self-pay | Admitting: Surgery

## 2023-01-06 LAB — SURGICAL PATHOLOGY

## 2023-01-18 ENCOUNTER — Encounter: Payer: Self-pay | Admitting: Surgery

## 2023-02-16 ENCOUNTER — Emergency Department: Payer: Medicare Other

## 2023-02-16 ENCOUNTER — Emergency Department
Admission: EM | Admit: 2023-02-16 | Discharge: 2023-02-16 | Disposition: A | Discharge status: Home / Self Care | Payer: Medicare Other | Attending: Emergency Medicine | Admitting: Emergency Medicine

## 2023-02-16 ENCOUNTER — Other Ambulatory Visit: Payer: Self-pay

## 2023-02-16 DIAGNOSIS — Z85038 Personal history of other malignant neoplasm of large intestine: Secondary | ICD-10-CM | POA: Insufficient documentation

## 2023-02-16 DIAGNOSIS — M549 Dorsalgia, unspecified: Secondary | ICD-10-CM | POA: Diagnosis present

## 2023-02-16 DIAGNOSIS — M545 Low back pain, unspecified: Secondary | ICD-10-CM | POA: Diagnosis not present

## 2023-02-16 DIAGNOSIS — Z87891 Personal history of nicotine dependence: Secondary | ICD-10-CM | POA: Diagnosis not present

## 2023-02-16 LAB — COMPREHENSIVE METABOLIC PANEL
ALT: 12 U/L (ref 0–44)
AST: 17 U/L (ref 15–41)
Albumin: 4.2 g/dL (ref 3.5–5.0)
Alkaline Phosphatase: 64 U/L (ref 38–126)
Anion gap: 12 (ref 5–15)
BUN: 13 mg/dL (ref 8–23)
CO2: 23 mmol/L (ref 22–32)
Calcium: 9 mg/dL (ref 8.9–10.3)
Chloride: 103 mmol/L (ref 98–111)
Creatinine, Ser: 0.72 mg/dL (ref 0.44–1.00)
GFR, Estimated: >60 mL/min (ref >60)
Glucose, Bld: 115 mg/dL — ABNORMAL HIGH (ref 70–99)
Potassium: 4 mmol/L (ref 3.5–5.1)
Sodium: 138 mmol/L (ref 135–145)
Total Bilirubin: 1.1 mg/dL (ref 0.3–1.2)
Total Protein: 7.4 g/dL (ref 6.5–8.1)

## 2023-02-16 LAB — CBC
HCT: 46.7 % — ABNORMAL HIGH (ref 36.0–46.0)
Hemoglobin: 15.8 g/dL — ABNORMAL HIGH (ref 12.0–15.0)
MCH: 33.5 pg (ref 26.0–34.0)
MCHC: 33.8 g/dL (ref 30.0–36.0)
MCV: 99.2 fL (ref 80.0–100.0)
Platelets: 317 10*3/uL (ref 150–400)
RBC: 4.71 MIL/uL (ref 3.87–5.11)
RDW: 13.5 % (ref 11.5–15.5)
WBC: 11.5 10*3/uL — ABNORMAL HIGH (ref 4.0–10.5)
nRBC: 0 % (ref 0.0–0.2)

## 2023-02-16 LAB — LIPASE, BLOOD: Lipase: 30 U/L (ref 11–51)

## 2023-02-16 MED ORDER — NAPROXEN 250 MG PO TABS: 250.0000 mg | ORAL_TABLET | Freq: Two times a day (BID) | ORAL | 0 refills | Status: AC

## 2023-02-16 MED ORDER — LIDOCAINE 5 % EX PTCH: 1.0000 | MEDICATED_PATCH | CUTANEOUS | 0 refills | Status: AC

## 2023-02-16 MED ORDER — IOHEXOL 300 MG/ML  SOLN
100.0000 mL | Freq: Once | INTRAMUSCULAR | Status: AC | PRN
  Administered 2023-02-16: 100 mL via INTRAVENOUS

## 2023-02-16 MED ORDER — HYDROMORPHONE HCL 1 MG/ML IJ SOLN
0.5000 mg | Freq: Once | INTRAMUSCULAR | Status: AC
  Administered 2023-02-16: 0.5 mg via INTRAVENOUS
  Filled 2023-02-16: qty 0.5

## 2023-02-16 MED ORDER — KETOROLAC TROMETHAMINE 30 MG/ML IJ SOLN
15.0000 mg | Freq: Once | INTRAMUSCULAR | Status: AC
  Administered 2023-02-16: 15 mg via INTRAVENOUS
  Filled 2023-02-16: qty 1

## 2023-02-16 NOTE — ED Provider Notes (Signed)
[facility] Provider Note    Event Date/Time   First MD Initiated Contact with Patient 02/16/23 1231     (approximate)   History   Abdominal Pain   HPI  Shelby Stone is a 72 y.o. female  with pmh diverticulitis, HLD, colon polyps who p/w back and abdominal pain.  Started on Sunday.  Pain is located in the low back radiates around to the roll lower quadrants of the abdomen.  Is a constant pain.  Feels worse when she moves around or tries to sit down or lay down.  Denies any urinary symptoms or incontinence.  Pain is worse in the left buttock but does not radiate down her leg.  No numbness tingling weakness.  No fevers or chills.  Patient saw her primary doctor several days ago and was given antibiotics for UTI.  However she called primary office today and was told the urine culture was negative and that she did not have a UTI.  Has been taking Tylenol only for it.  Patient was doing yard work the day before symptoms started but she says she routinely does yard work.  Denies history of similar pain.    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   Nephritis    Personal history of tobacco use, presenting hazards to health    Special screening for malignant neoplasms, colon     Patient Active Problem List   Diagnosis Date Noted   Malignant neoplasm of ascending colon [facility])    Special screening for malignant neoplasms, colon      Physical Exam  Triage Vital Signs: ED Triage Vitals  Enc Vitals Group     BP 02/16/23 1041 (!) 164/86     Pulse Rate 02/16/23 1041 98     Resp 02/16/23 1041 18     Temp 02/16/23 1043 98.4 F (36.9 C)     Temp Source 02/16/23 1043 Oral     SpO2 02/16/23 1041 100 %     Weight 02/16/23 1042 135 lb (61.2 kg)     Height  02/16/23 1042 5\' 3"  (1.6 m)     Head Circumference --      Peak Flow --      Pain Score 02/16/23 1042 8     Pain Loc --      Pain Edu? --      Excl. in GC? --     Most recent vital signs: Vitals:   02/16/23 1043 02/16/23 1454  BP:  (!) 148/86  Pulse:  96  Resp:  18  Temp: 98.4 F (36.9 C) 98.3 F (36.8 C)  SpO2:  100%     General: Awake, no distress.  CV:  Good peripheral perfusion.  Resp:  Normal effort.  Abd:  No distention.  Mild tenderness in bilateral lower quadrants and suprapubic region but no guarding Neuro:             Awake, Alert, Oriented x 3  Other:  + Lumbar midline tenderness and tenderness over the left SI joint 5/5 strength with hip flexion, planta flexion/dorsiflexion BL lower extremities   ED Results / Procedures / Treatments  Labs (all labs ordered are listed, but only abnormal results are displayed) Labs Reviewed  COMPREHENSIVE  METABOLIC PANEL - Abnormal; Notable for the following components:      Result Value   Glucose, Bld 115 (*)    All other components within normal limits  CBC - Abnormal; Notable for the following components:   WBC 11.5 (*)    Hemoglobin 15.8 (*)    HCT 46.7 (*)    All other components within normal limits  LIPASE, BLOOD  URINALYSIS, ROUTINE W REFLEX MICROSCOPIC     EKG     RADIOLOGY I reviewed interpreted the CT of the abdomen pelvis which showed no acute pathology   PROCEDURES:  Critical Care performed: No  Procedures   MEDICATIONS ORDERED IN ED: Medications  HYDROmorphone (DILAUDID) injection 0.5 mg (0.5 mg Intravenous Given 02/16/23 1334)  iohexol (OMNIPAQUE) 300 MG/ML solution 100 mL (100 mLs Intravenous Contrast Given 02/16/23 1402)     IMPRESSION / MDM / ASSESSMENT AND PLAN / ED COURSE  I reviewed the triage vital signs and the nursing notes.                              Patient's presentation is most consistent with acute complicated illness / injury requiring diagnostic workup.  Differential  diagnosis includes, but is not limited to, musculoskeletal low back pain, compression fracture, exam not consistent with cauda equina syndrome, kidney stone, UTI, aortic pathology, diverticulitis,  72 year old female presents with low back pain and abdominal pain.  Symptoms started about 3 days ago.  The day prior she had been doing yard work.  Pain is primarily in the low back but does radiate around to the bilateral lower quadrants of the abdomen.  It is a constant pain.  No no associated nausea vomiting bowel or bladder changes or urinary symptoms.  Has not had fever.  Has been taking Tylenol for it.  The low back pain does radiate into the left buttock but not down the leg and she denies any numbness or weakness in her lower extremities is still ambulatory.  Patient is hypertensive on arrival.  She does look uncomfortable.  She does have midline lumbar tenderness as well as tenderness over the left SI joint.  She has good strength and sensation in her extremities.  Does have tenderness of bilateral quadrants of the abdomen.  My suspicion is that this is primarily musculoskeletal low back pain that is referred to the abdomen but given her age and abdominal tenderness will obtain a CT of the abdomen pelvis.  Will add on a L-spine CT as well.  Will give IV Dilaudid for pain.   CT of the abdomen pelvis has no acute pathology.  CT L-spine shows chronic changes as well as severe to space narrowing at L1-L2.  There is no acute fracture.  On reassessment patient is feeling improved.  Given she is able to ambulate without any radicular symptoms or signs or symptoms of spinal cord compression I do not think she needs MRI or further workup today.  I do think her pain is all related to her low back.  She has been taking Tylenol at home.  Will prescribe Lidoderm patch and given she has no other comorbidities short course of naproxen.  Discussed maintaining activity and avoiding bedrest while avoiding activities that  exacerbate her pain.  Recommended follow-up with primary care.  Discussed findings that are more concerning for cauda equina syndrome.      FINAL CLINICAL IMPRESSION(S) / ED DIAGNOSES   Final diagnoses:  Acute  midline low back pain without sciatica     Rx / DC Orders   ED Discharge Orders     None        Note:  This document was prepared using Dragon voice recognition software and may include unintentional dictation errors.   Georga Hacking, MD 02/16/23 1556

## 2023-02-16 NOTE — Discharge Instructions (Addendum)
CT of the abdomen pelvis was reassuring.  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  Continue to take Tylenol for your back.  You can also take naproxen twice a day.  You can also use the Lidoderm patch.  Please follow-up with your primary care doctor if symptoms or not improving.  Eventually he may need to have an MRI or to see a specialist.

## 2023-02-16 NOTE — ED Triage Notes (Signed)
Lower abd pain radiates to back worse on left. Pt was seen at PCP Monday and was given antibiotic for UTI however was told she did not have a UTI. Denies vomiting. Decreased appetite. X 2 days

## 2024-03-30 ENCOUNTER — Encounter: Payer: Self-pay | Admitting: Advanced Practice Midwife

## 2024-06-22 ENCOUNTER — Other Ambulatory Visit: Payer: Self-pay | Admitting: Urgent Care

## 2024-06-22 DIAGNOSIS — Z122 Encounter for screening for malignant neoplasm of respiratory organs: Secondary | ICD-10-CM

## 2024-06-26 ENCOUNTER — Ambulatory Visit
Admission: RE | Admit: 2024-06-26 | Discharge: 2024-06-26 | Disposition: A | Discharge status: Home / Self Care | Source: Ambulatory Visit | Attending: Urgent Care | Admitting: Urgent Care

## 2024-06-26 DIAGNOSIS — Z122 Encounter for screening for malignant neoplasm of respiratory organs: Secondary | ICD-10-CM

## 2024-10-05 ENCOUNTER — Other Ambulatory Visit: Payer: Self-pay

## 2024-10-12 ENCOUNTER — Other Ambulatory Visit: Payer: Self-pay

## 2024-10-12 DIAGNOSIS — Z13820 Encounter for screening for osteoporosis: Secondary | ICD-10-CM
